# Patient Record
Sex: Female | Born: 1985 | Race: Black or African American | Hispanic: No | Marital: Single | Smoking: Never smoker
Health system: Southern US, Community
[De-identification: ages and names within clinical notes are randomized; demographics above are authoritative.]

## PROBLEM LIST (undated history)

## (undated) DIAGNOSIS — M797 Fibromyalgia: Secondary | ICD-10-CM

## (undated) DIAGNOSIS — G43909 Migraine, unspecified, not intractable, without status migrainosus: Secondary | ICD-10-CM

## (undated) HISTORY — PX: TUBAL LIGATION: SHX77

## (undated) HISTORY — PX: CHOLECYSTECTOMY: SHX55

## (undated) HISTORY — PX: TONSILLECTOMY: SUR1361

## (undated) HISTORY — PX: KNEE ARTHROSCOPY: SHX127

## (undated) HISTORY — PX: ROTATOR CUFF REPAIR: SHX139

---

## 2015-07-09 ENCOUNTER — Emergency Department (HOSPITAL_COMMUNITY): Payer: Medicare Other

## 2015-07-09 ENCOUNTER — Emergency Department (HOSPITAL_COMMUNITY)
Admission: EM | Admit: 2015-07-09 | Discharge: 2015-07-09 | Disposition: A | Discharge status: Home / Self Care | Payer: Medicare Other | Attending: Emergency Medicine | Admitting: Emergency Medicine

## 2015-07-09 ENCOUNTER — Encounter (HOSPITAL_COMMUNITY): Payer: Self-pay | Admitting: Emergency Medicine

## 2015-07-09 DIAGNOSIS — X58XXXA Exposure to other specified factors, initial encounter: Secondary | ICD-10-CM | POA: Diagnosis not present

## 2015-07-09 DIAGNOSIS — Y939 Activity, unspecified: Secondary | ICD-10-CM | POA: Insufficient documentation

## 2015-07-09 DIAGNOSIS — Z880 Allergy status to penicillin: Secondary | ICD-10-CM | POA: Diagnosis not present

## 2015-07-09 DIAGNOSIS — Y999 Unspecified external cause status: Secondary | ICD-10-CM | POA: Diagnosis not present

## 2015-07-09 DIAGNOSIS — Z8739 Personal history of other diseases of the musculoskeletal system and connective tissue: Secondary | ICD-10-CM | POA: Diagnosis not present

## 2015-07-09 DIAGNOSIS — S8991XA Unspecified injury of right lower leg, initial encounter: Secondary | ICD-10-CM | POA: Diagnosis present

## 2015-07-09 DIAGNOSIS — Y929 Unspecified place or not applicable: Secondary | ICD-10-CM | POA: Insufficient documentation

## 2015-07-09 HISTORY — DX: Fibromyalgia: M79.7

## 2015-07-09 MED ORDER — CYCLOBENZAPRINE HCL 10 MG PO TABS: 5.0000 mg | ORAL_TABLET | Freq: Two times a day (BID) | ORAL | Status: DC | PRN

## 2015-07-09 MED ORDER — CYCLOBENZAPRINE HCL 10 MG PO TABS: 5.0000 mg | ORAL_TABLET | Freq: Once | ORAL | Status: DC

## 2015-07-09 NOTE — ED Notes (Signed)
Declined W/C at D/C and was escorted to lobby by RN. 

## 2015-07-09 NOTE — Discharge Instructions (Signed)
Knee Immobilizer A knee immobilizer is used to support and protect an injured or painful knee. Knee immobilizers keep your knee from being used while it is healing. Some of the common immobilizers used include splints (air, plaster, fiberglass, stiff cloth, or aluminum) or casts. Wear your knee immobilizer as instructed and only remove it as instructed. HOME CARE INSTRUCTIONS   Use absorbent powder (such as baby powder or talcum powder) to control irritation from sweat and friction.  Adjust the immobilizer to be firm but not tight. Signs of an immobilizer that is too tight include:  Swelling.  Numbness.  Color change in your foot or ankle.  Increased pain.  While resting, raise your leg above the level of your heart. Pillows can be used for support. This reduces throbbing and helps healing.  Remove the immobilizer to bathe and sleep. SEEK MEDICAL CARE IF:   You have increasing pain or swelling in the knee, foot, or ankle.  You have problems caused by the knee immobilizer, or it breaks or needs replacement. MAKE SURE YOU:   Understand these instructions.  Will watch your condition.  Will get help right away if you are not doing well or get worse. Document Released: 09/28/2005 Document Revised: 02/12/2014 Document Reviewed: 05/22/2013 [facility] Patient Information 2015 [place], [place]. This information is not intended to replace advice given to you by your health care provider. Make sure you discuss any questions you have with your health care provider.   Meniscus Injury, Arthroscopy Arthroscopy is a surgical procedure that involves the use of a small scope that has a camera and surgical instruments on the end (arthroscope). An arthroscope can be used to repair your meniscus injury.  LET [facility] CARE PROVIDER KNOW ABOUT:  Any allergies you have.  All medicines you are taking, including vitamins, herbs, eyedrops, creams, and over-the-counter medicines.  Any recent colds  or infections you have had or currently have.  Previous problems you or members of your family have had with the use of anesthetics.  Any blood disorders or blood clotting problems you have.  Previous surgeries you have had.  Medical conditions you have. RISKS AND COMPLICATIONS Generally, this is a safe procedure. However, as with any procedure, problems can occur. Possible problems include:  Damage to nerves or blood vessels.  Excess bleeding.  Blood clots.  Infection. BEFORE THE PROCEDURE  Do not eat or drink for 6-8 hours before the procedure.  Take medicines as directed by your surgeon. Ask your surgeon about changing or stopping your regular medicines.  You may have lab tests the morning of surgery. PROCEDURE  You will be given one of the following:   A medicine that numbs the area (local anesthesia).  A medicine that makes you go to sleep (general anesthesia).  A medicine injected into your spine that numbs your body below the waist (spinal anesthesia). Most often, several small cuts (incisions) are made in the knee. The arthroscope and instruments go into the incisions to repair the damage. The torn portion of the meniscus is removed.  During this time, your surgeon may find a partial or complete tear in a cruciate ligament, such as the anterior cruciate ligament (ACL). A completely torn cruciate ligament is reconstructed by taking tissue from another part of the body (grafting) and placing it into the injured area. This requires several larger incisions to complete the repair. Sometimes, open surgery is needed for collateral ligament injuries. If a collateral ligament is found to be injured, your surgeon may  staple or suture the tear through a slightly larger incision on the side of the knee. AFTER THE PROCEDURE You will be taken to the recovery area where your progress will be monitored. When you are awake, stable, and taking fluids without complications, you will be  allowed to go home. This is usually the same day. However, more extensive repairs of a ligament may require an overnight stay.  The recovery time after repairing your meniscus or ligament depends on the amount of damage to these structures. It also depends on whether or not reconstructive knee surgery was needed.   A torn or stretched ligament (ligament sprain) may take 6-8 weeks to heal. It takes about the same amount of time if your surgeon removed a torn meniscus.  A repaired meniscus may require 6-12 weeks of recovery time.  A torn ligament needing reconstructive surgery may take 6-12 months to heal fully. Document Released: 09/25/2000 Document Revised: 10/03/2013 Document Reviewed: 02/24/2013 [facility] Patient Information 2015 [place], [place]. This information is not intended to replace advice given to you by your health care provider. Make sure you discuss any questions you have with your health care provider.

## 2015-07-09 NOTE — ED Notes (Signed)
Pt sts right knee pain x 2 days; pt sts hx of sx on same knee but denies obvious new injury

## 2015-07-09 NOTE — ED Provider Notes (Signed)
CSN: [id number]     Arrival date & time 07/09/15  1017 History  By signing my name below, I, Sylvia Sullivan, attest that this documentation has been prepared under the direction and in the presence of Sylvia Pel, PA-C. Electronically Signed: Tanda Sullivan, ED Scribe. 07/09/2015. 1:19 PM.  Chief Complaint  Patient presents with  . Knee Pain   The history is provided by the patient. No language interpreter was used.     HPI Comments: Sylvia Sullivan is a 29 y.o. female who presents to the Emergency Department complaining of gradual onset, constant, 7/10, right knee pain and swelling x 2 days. Pt has hx of right knee surgery x 3 for plica removal and meniscus tear. 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 Pt does admit to walking and climbing stairs more frequently since going to college. She also went hiking 3 weeks ago and believes she may have strained her knee. No known injury, trauma, or fall. She has been icing knee, OTC pain patches, and taking naproxen without relief. Denies weakness, numbness, tingling, or any other associated symptoms.   Past Medical History  Diagnosis Date  . Fibromyalgia    History reviewed. No pertinent past surgical history. History reviewed. No pertinent family history. Social History  Substance Use Topics  . Smoking status: Never Smoker   . Smokeless tobacco: None  . Alcohol Use: No   OB History    No data available     Review of Systems  Musculoskeletal: Positive for arthralgias (Right knee pain). Negative for gait problem.  Skin: Negative for wound.  Neurological: Negative for weakness and numbness.  All other systems reviewed and are negative.  Allergies  Butane; Penicillins; Tramadol; and Vicodin  Home Medications   Prior to Admission medications   Medication Sig Start Date End Date Taking? Authorizing Provider  cyclobenzaprine (FLEXERIL) 10 MG tablet Take 0.5-1 tablets (5-10 mg total) by mouth 2 (two) times daily as  needed. 07/09/15   Sylvia Pel, PA-C   Triage Vitals: BP 133/99 mmHg  Pulse 59  Temp(Src) 98.5 F (36.9 C) (Oral)  Resp 18  SpO2 100%   Physical Exam  Constitutional: She is oriented to person, place, and time. She appears well-developed and well-nourished. No distress.  HENT:  Head: Normocephalic and atraumatic.  Eyes: Conjunctivae and EOM are normal.  Neck: Neck supple. No tracheal deviation present.  Cardiovascular: Normal rate.   Pulmonary/Chest: Effort normal. No respiratory distress.  Musculoskeletal:       Right knee: She exhibits decreased range of motion (due to pan) and swelling. She exhibits no effusion, no ecchymosis, no deformity, no laceration, no erythema, normal alignment, no LCL laxity and normal patellar mobility. Tenderness found. Medial joint line and lateral joint line tenderness noted. No patellar tendon tenderness noted.  Neurological: She is alert and oriented to person, place, and time.  Skin: Skin is warm and dry.  Psychiatric: She has a normal mood and affect. Her behavior is normal.  Nursing note and vitals reviewed.   ED Course  Procedures (including critical care time)  DIAGNOSTIC STUDIES: Oxygen Saturation is 100% on RA, normal by my interpretation.    COORDINATION OF CARE: 1:19 PM-Discussed treatment plan which includes knee immobilizer, crutches, and referral to orthopedist with pt at bedside and pt agreed to plan.   Labs Review Labs Reviewed - No data to display  Imaging Review Dg Knee Complete 4 Views Right  07/09/2015   CLINICAL DATA:  Pain in both femoral  condyles no known injury; for approximately 4 days  EXAM: RIGHT KNEE - COMPLETE 4+ VIEW  COMPARISON:  None.  FINDINGS: There is no evidence of fracture, dislocation, or joint effusion. There is no evidence of arthropathy or other focal bone abnormality. Soft tissues are unremarkable.  IMPRESSION: Negative.   Electronically Signed   By: Sylvia Sullivan M.D.   On: 07/09/2015 12:18   I  have personally reviewed and evaluated these images as part of my medical decision-making.   EKG Interpretation None      MDM   Final diagnoses:  Knee injury, right, initial encounter    Right Knee xray IMPRESSION: Negative.  Medications - No data to display  29 y.o.Sylvia Sullivan's evaluation in the Emergency Department is complete. It has been determined that no acute conditions requiring further emergency intervention are present at this time. The patient/guardian have been advised of the diagnosis and plan. We have discussed signs and symptoms that warrant return to the ED, such as changes or worsening in symptoms.  Vital signs are stable at discharge. Filed Vitals:   07/09/15 1029  BP: 133/99  Pulse: 59  Temp: 98.5 F (36.9 C)  Resp: 18    Patient/guardian has voiced understanding and agreed to follow-up with the PCP or specialist.  I personally performed the services described in this documentation, which was scribed in my presence. The recorded information has been reviewed and is accurate.       Sylvia Pel, PA-C 07/09/15 1333  Sylvia Spates, MD 07/10/15 1200

## 2015-07-09 NOTE — ED Notes (Signed)
Hx of right knee surgery x 3 per pt. Last in 2008 in [place]. Started 3-4 days ago with right knee pain. Has been doing more walking and climbing stairs since going to college.

## 2015-09-22 ENCOUNTER — Encounter (HOSPITAL_COMMUNITY): Payer: Self-pay | Admitting: Emergency Medicine

## 2015-09-22 ENCOUNTER — Emergency Department (HOSPITAL_COMMUNITY)
Admission: EM | Admit: 2015-09-22 | Discharge: 2015-09-22 | Disposition: A | Discharge status: Home / Self Care | Payer: Medicare Other | Attending: Emergency Medicine | Admitting: Emergency Medicine

## 2015-09-22 DIAGNOSIS — M25512 Pain in left shoulder: Secondary | ICD-10-CM | POA: Diagnosis not present

## 2015-09-22 DIAGNOSIS — Z880 Allergy status to penicillin: Secondary | ICD-10-CM | POA: Diagnosis not present

## 2015-09-22 DIAGNOSIS — Z9889 Other specified postprocedural states: Secondary | ICD-10-CM | POA: Insufficient documentation

## 2015-09-22 MED ORDER — CYCLOBENZAPRINE HCL 10 MG PO TABS: 10.0000 mg | ORAL_TABLET | Freq: Two times a day (BID) | ORAL | Status: AC | PRN

## 2015-09-22 MED ORDER — OXYCODONE-ACETAMINOPHEN 5-325 MG PO TABS
2.0000 | ORAL_TABLET | Freq: Once | ORAL | Status: AC
  Administered 2015-09-22: 2 via ORAL
  Filled 2015-09-22: qty 2

## 2015-09-22 MED ORDER — OXYCODONE-ACETAMINOPHEN 5-325 MG PO TABS: 1.0000 | ORAL_TABLET | Freq: Four times a day (QID) | ORAL | Status: DC | PRN

## 2015-09-22 NOTE — ED Notes (Signed)
Patient with Hx of multiple rotator cuff surgeries to left shoulder c/o left shoulder pain onset yesterday. Denies trauma.

## 2015-09-22 NOTE — ED Provider Notes (Signed)
CSN: [id number]     Arrival date & time 09/22/15  1601 History  By signing my name below, I, Sylvia Sullivan, attest that this documentation has been prepared under the direction and in the presence of Danelle Berry, PA-C. Electronically Signed: Tanda Sullivan, ED Scribe. 09/22/2015. 5:59 PM.    Chief Complaint  Patient presents with  . Shoulder Pain   The history is provided by the patient. No language interpreter was used.     HPI Comments: Sylvia Sullivan is a 29 y.o. female who presents to the Emergency Department complaining of sudden onset, constant, 10/10, left shoulder pain radiating down to left arm x 2 days, worsening today. No known injury, trauma, fall, or strenuous activity that could have caused the pain. She has been applying heat/ ice, taking Naproxen, and trying to stretch the shoulder without relief. Denies redness, swelling, weakness, numbness, tingling, fever, nausea, vomiting, or any other associated symptoms. No hx blood clots. No recent prolonged travel. Pt has hx of multiple rotator cuff surgeries to the same shoulder and has been getting steroid injections to the shoulder. Pt's last injection was 4 months ago.  She states that the pain feels similar to after her shoulder surgeries when the nerve block wears off. No other complaints.    Past Medical History  Diagnosis Date  . Fibromyalgia    History reviewed. No pertinent past surgical history. History reviewed. No pertinent family history. Social History  Substance Use Topics  . Smoking status: Never Smoker   . Smokeless tobacco: None  . Alcohol Use: No   OB History    No data available     Review of Systems  Constitutional: Negative for fever.  Gastrointestinal: Negative for nausea and vomiting.  Musculoskeletal: Positive for arthralgias (Left shoulder. ). Negative for joint swelling.  Skin: Negative for color change and wound.  Neurological: Negative for weakness and numbness.  All other systems reviewed  and are negative.  Allergies  Butane; Penicillins; Tramadol; and Vicodin  Home Medications   Prior to Admission medications   Medication Sig Start Date End Date Taking? Authorizing Provider  cyclobenzaprine (FLEXERIL) 10 MG tablet Take 0.5-1 tablets (5-10 mg total) by mouth 2 (two) times daily as needed. 07/09/15   Marlon Pel, PA-C   Triage Vitals: BP 132/96 mmHg  Pulse 72  Temp(Src) 98.5 F (36.9 C) (Oral)  Resp 18  SpO2 100%   Physical Exam  Constitutional: She is oriented to person, place, and time. She appears well-developed and well-nourished. No distress.  HENT:  Head: Normocephalic and atraumatic.  Right Ear: External ear normal.  Left Ear: External ear normal.  Nose: Nose normal.  Mouth/Throat: Oropharynx is clear and moist. No oropharyngeal exudate.  Eyes: Conjunctivae and EOM are normal. Pupils are equal, round, and reactive to light. Right eye exhibits no discharge. Left eye exhibits no discharge. No scleral icterus.  Neck: Normal range of motion. Neck supple. No JVD present. No tracheal deviation present.  Cardiovascular: Normal rate and regular rhythm.   Pulses:      Radial pulses are 2+ on the right side, and 2+ on the left side.  Pulmonary/Chest: Effort normal and breath sounds normal. No stridor. No respiratory distress.  Musculoskeletal: She exhibits tenderness. She exhibits no edema.       Left shoulder: She exhibits decreased range of motion, tenderness, bony tenderness and pain. She exhibits no swelling, no effusion, no deformity, no spasm and normal pulse.       Arms: Lymphadenopathy:  She has no cervical adenopathy.  Neurological: She is alert and oriented to person, place, and time. No sensory deficit. She exhibits normal muscle tone. Coordination normal.  Skin: Skin is warm and dry. No rash noted. She is not diaphoretic. No erythema. No pallor.  Psychiatric: She has a normal mood and affect. Her behavior is normal. Judgment and thought content  normal.  Nursing note and vitals reviewed.   ED Course  Procedures (including critical care time)  DIAGNOSTIC STUDIES: Oxygen Saturation is 100% on RA, normal by my interpretation.    COORDINATION OF CARE: 5:53 PM-Discussed treatment plan which includes pain medication with pt at bedside and pt agreed to plan.   Labs Review Labs Reviewed - No data to display  Imaging Review No results found.   EKG Interpretation None      MDM   Pt with left shoulder pain, limited ROM, states she has had 4-5 rotator cuff surgeries, this feels like a surgery, however she denies injury, trauma, just new pain. Pt is neurovascularly intact.  She was placed in a sling immobilized and encouraged to follow up with her Ortho surgeon (s).  No indication to image.  Shoulder is tender, but there is no erythema or warmth, no concern for septic joint. D/c with muscle relaxers, pain meds, sling for comfort.  Final diagnoses:  None   I personally performed the services described in this documentation, which was scribed in my presence. The recorded information has been reviewed and is accurate.        Danelle BerryLeisa Marvelle Span, PA-C 10/08/15 [phone]  Laurence Spatesachel Morgan Little, MD 10/09/15 [phone]

## 2015-09-22 NOTE — Discharge Instructions (Signed)
Shoulder Pain The shoulder is the joint that connects your arms to your body. The bones that form the shoulder joint include the upper arm bone (humerus), the shoulder blade (scapula), and the collarbone (clavicle). The top of the humerus is shaped like a ball and fits into a rather flat socket on the scapula (glenoid cavity). A combination of muscles and strong, fibrous tissues that connect muscles to bones (tendons) support your shoulder joint and hold the ball in the socket. Small, fluid-filled sacs (bursae) are located in different areas of the joint. They act as cushions between the bones and the overlying soft tissues and help reduce friction between the gliding tendons and the bone as you move your arm. Your shoulder joint allows a wide range of motion in your arm. This range of motion allows you to do things like scratch your back or throw a ball. However, this range of motion also makes your shoulder more prone to pain from overuse and injury. Causes of shoulder pain can originate from both injury and overuse and usually can be grouped in the following four categories:  Redness, swelling, and pain (inflammation) of the tendon (tendinitis) or the bursae (bursitis).  Instability, such as a dislocation of the joint.  Inflammation of the joint (arthritis).  Broken bone (fracture). HOME CARE INSTRUCTIONS   Apply ice to the sore area.  Put ice in a plastic bag.  Place a towel between your skin and the bag.  Leave the ice on for 15-20 minutes, 3-4 times per day for the first 2 days, or as directed by your health care provider.  Stop using cold packs if they do not help with the pain.  If you have a shoulder sling or immobilizer, wear it as long as your caregiver instructs. Only remove it to shower or bathe. Move your arm as little as possible, but keep your hand moving to prevent swelling.  Squeeze a soft ball or foam pad as much as possible to help prevent swelling.  Only take  over-the-counter or prescription medicines for pain, discomfort, or fever as directed by your caregiver. SEEK MEDICAL CARE IF:   Your shoulder pain increases, or new pain develops in your arm, hand, or fingers.  Your hand or fingers become cold and numb.  Your pain is not relieved with medicines. SEEK IMMEDIATE MEDICAL CARE IF:   Your arm, hand, or fingers are numb or tingling.  Your arm, hand, or fingers are significantly swollen or turn white or blue. MAKE SURE YOU:   Understand these instructions.  Will watch your condition.  Will get help right away if you are not doing well or get worse.   This information is not intended to replace advice given to you by your health care provider. Make sure you discuss any questions you have with your health care provider.   Document Released: 07/08/2005 Document Revised: 10/19/2014 Document Reviewed: 01/21/2015 Elsevier Interactive Patient Education 2016 [employer] Rotator Cuff Injury Rotator cuff injury is any type of injury to the set of muscles and tendons that make up the stabilizing unit of your shoulder. This unit holds the ball of your upper arm bone (humerus) in the socket of your shoulder blade (scapula).  CAUSES Injuries to your rotator cuff most commonly come from sports or activities that cause your arm to be moved repeatedly over your head. Examples of this include throwing, weight lifting, swimming, or racquet sports. Long lasting (chronic) irritation of your rotator cuff can cause soreness and swelling (  inflammation), bursitis, and eventual damage to your tendons, such as a tear (rupture). SIGNS AND SYMPTOMS Acute rotator cuff tear:  Sudden tearing sensation followed by severe pain shooting from your upper shoulder down your arm toward your elbow.  Decreased range of motion of your shoulder because of pain and muscle spasm.  Severe pain.  Inability to raise your arm out to the side because of pain and loss of muscle  power (large tears). Chronic rotator cuff tear:  Pain that usually is worse at night and may interfere with sleep.  Gradual weakness and decreased shoulder motion as the pain worsens.  Decreased range of motion. Rotator cuff tendinitis:  Deep ache in your shoulder and the outside upper arm over your shoulder.  Pain that comes on gradually and becomes worse when lifting your arm to the side or turning it inward. DIAGNOSIS Rotator cuff injury is diagnosed through a medical history, physical exam, and imaging exam. The medical history helps determine the type of rotator cuff injury. Your health care provider will look at your injured shoulder, feel the injured area, and ask you to move your shoulder in different positions. X-ray exams typically are done to rule out other causes of shoulder pain, such as fractures. MRI is the exam of choice for the most severe shoulder injuries because the images show muscles and tendons.  TREATMENT  Chronic tear:  Medicine for pain, such as acetaminophen or ibuprofen.  Physical therapy and range-of-motion exercises may be helpful in maintaining shoulder function and strength.  Steroid injections into your shoulder joint.  Surgical repair of the rotator cuff if the injury does not heal with noninvasive treatment. Acute tear:  Anti-inflammatory medicines such as ibuprofen and naproxen to help reduce pain and swelling.  A sling to help support your arm and rest your rotator cuff muscles. Long-term use of a sling is not advised. It may cause significant stiffening of the shoulder joint.  Surgery may be considered within a few weeks, especially in younger, active people, to return the shoulder to full function.  Indications for surgical treatment include the following:  Age younger than 60 years.  Rotator cuff tears that are complete.  Physical therapy, rest, and anti-inflammatory medicines have been used for 6-8 weeks, with no  improvement.  Employment or sporting activity that requires constant shoulder use. Tendinitis:  Anti-inflammatory medicines such as ibuprofen and naproxen to help reduce pain and swelling.  A sling to help support your arm and rest your rotator cuff muscles. Long-term use of a sling is not advised. It may cause significant stiffening of the shoulder joint.  Severe tendinitis may require:  Steroid injections into your shoulder joint.  Physical therapy.  Surgery. HOME CARE INSTRUCTIONS   Apply ice to your injury:  Put ice in a plastic bag.  Place a towel between your skin and the bag.  Leave the ice on for 20 minutes, 2-3 times a day.  If you have a shoulder immobilizer (sling and straps), wear it until told otherwise by your health care provider.  You may want to sleep on several pillows or in a recliner at night to lessen swelling and pain.  Only take over-the-counter or prescription medicines for pain, discomfort, or fever as directed by your health care provider.  Do simple hand squeezing exercises with a soft rubber ball to decrease hand swelling. SEEK MEDICAL CARE IF:   Your shoulder pain increases, or new pain or numbness develops in your arm, hand, or fingers.  Your  hand or fingers are colder than your other hand. SEEK IMMEDIATE MEDICAL CARE IF:   Your arm, hand, or fingers are numb or tingling.  Your arm, hand, or fingers are increasingly swollen and painful, or they turn white or blue. MAKE SURE YOU:  Understand these instructions.  Will watch your condition.  Will get help right away if you are not doing well or get worse.   This information is not intended to replace advice given to you by your health care provider. Make sure you discuss any questions you have with your health care provider.   Document Released: 09/25/2000 Document Revised: 10/03/2013 Document Reviewed: 05/10/2013 Elsevier Interactive Patient Education 2016 Elsevier  Inc[employer]  Cryotherapy Cryotherapy is when you put ice on your injury. Ice helps lessen pain and puffiness (swelling) after an injury. Ice works the best when you start using it in the first 24 to 48 hours after an injury. HOME CARE  Put a dry or damp towel between the ice pack and your skin.  You may press gently on the ice pack.  Leave the ice on for no more than 10 to 20 minutes at a time.  Check your skin after 5 minutes to make sure your skin is okay.  Rest at least 20 minutes between ice pack uses.  Stop using ice when your skin loses feeling (numbness).  Do not use ice on someone who cannot tell you when it hurts. This includes small children and people with memory problems (dementia). GET HELP RIGHT AWAY IF:  You have white spots on your skin.  Your skin turns blue or pale.  Your skin feels waxy or hard.  Your puffiness gets worse. MAKE SURE YOU:   Understand these instructions.  Will watch your condition.  Will get help right away if you are not doing well or get worse.   This information is not intended to replace advice given to you by your health care provider. Make sure you discuss any questions you have with your health care provider.   Document Released: 03/16/2008 Document Revised: 12/21/2011 Document Reviewed: 05/21/2011 Elsevier Interactive Patient Education [employer]

## 2015-12-03 ENCOUNTER — Encounter (HOSPITAL_COMMUNITY): Payer: Self-pay | Admitting: *Deleted

## 2015-12-03 ENCOUNTER — Emergency Department (HOSPITAL_COMMUNITY)
Admission: EM | Admit: 2015-12-03 | Discharge: 2015-12-03 | Disposition: A | Discharge status: Home / Self Care | Payer: Medicare Other | Attending: Emergency Medicine | Admitting: Emergency Medicine

## 2015-12-03 DIAGNOSIS — M549 Dorsalgia, unspecified: Secondary | ICD-10-CM | POA: Insufficient documentation

## 2015-12-03 DIAGNOSIS — M79604 Pain in right leg: Secondary | ICD-10-CM | POA: Diagnosis not present

## 2015-12-03 DIAGNOSIS — M797 Fibromyalgia: Secondary | ICD-10-CM | POA: Insufficient documentation

## 2015-12-03 DIAGNOSIS — M5431 Sciatica, right side: Secondary | ICD-10-CM

## 2015-12-03 DIAGNOSIS — Z880 Allergy status to penicillin: Secondary | ICD-10-CM | POA: Insufficient documentation

## 2015-12-03 DIAGNOSIS — M545 Low back pain: Secondary | ICD-10-CM | POA: Diagnosis present

## 2015-12-03 MED ORDER — OXYCODONE-ACETAMINOPHEN 5-325 MG PO TABS: 1.0000 | ORAL_TABLET | ORAL | Status: DC | PRN

## 2015-12-03 MED ORDER — PREDNISONE 20 MG PO TABS: ORAL_TABLET | ORAL | Status: AC

## 2015-12-03 NOTE — ED Notes (Signed)
Patient states for the past 2-3 days she has been having a pain in her back that shoots down her right leg. Patient denies injury. She states it hurts when she active and when she is at rest. She has never had this pain before and denies any strenuous activity recently.

## 2015-12-03 NOTE — ED Notes (Signed)
Bed: WA28 Expected date:  Expected time:  Means of arrival:  Comments: 

## 2015-12-03 NOTE — ED Provider Notes (Signed)
CSN: [id number]     Arrival date & time 12/03/15  0830 History   First MD Initiated Contact with Patient 12/03/15 1009     Chief Complaint  Patient presents with  . Back Pain  . Leg Pain     (Consider location/radiation/quality/duration/timing/severity/associated sxs/prior Treatment) Patient is a 30 y.o. female presenting with back pain and leg pain. The history is provided by the patient and medical records.  Back Pain Associated symptoms: leg pain   Leg Pain Associated symptoms: back pain     31 year old female with history of fibromyalgia, presenting to the ED for back pain. Patient states been going on for 3 days. She states she has pain in her right lower back which radiates into her right leg. She denies any known injury, trauma, or falls. She states if she is sitting completely still, pain is tolerable, but becomes severe when she attempts to move, twist, or change position. She denies any numbness or weakness of her legs. No bowel or bladder incontinence. No fever, chills, history of cancer, or history of IV drug use. Patient has tried multiple over-the-counter remedies including ibuprofen, naproxen, heating pad, Epsom salt bath soaks, and Solonoas patches without relief.  VSS.  Past Medical History  Diagnosis Date  . Fibromyalgia    History reviewed. No pertinent past surgical history. No family history on file. Social History  Substance Use Topics  . Smoking status: Never Smoker   . Smokeless tobacco: None  . Alcohol Use: No   OB History    No data available     Review of Systems  Musculoskeletal: Positive for back pain.  All other systems reviewed and are negative.     Allergies  Butane; Penicillins; Tramadol; and Vicodin  Home Medications   Prior to Admission medications   Medication Sig Start Date End Date Taking? Authorizing Provider  cyclobenzaprine (FLEXERIL) 10 MG tablet Take 1 tablet (10 mg total) by mouth 2 (two) times daily as needed for muscle  spasms. 09/22/15   Danelle Berry, PA-C  oxyCODONE-acetaminophen (PERCOCET/ROXICET) 5-325 MG tablet Take 1-2 tablets by mouth every 6 (six) hours as needed for severe pain. 09/22/15   Danelle Berry, PA-C   BP 110/79 mmHg  Pulse 68  Temp(Src) 98.2 F (36.8 C) (Oral)  Resp 16  SpO2 100%  LMP 11/03/2015   Physical Exam  Constitutional: She is oriented to person, place, and time. She appears well-developed and well-nourished.  HENT:  Head: Normocephalic and atraumatic.  Mouth/Throat: Oropharynx is clear and moist.  Eyes: Conjunctivae and EOM are normal. Pupils are equal, round, and reactive to light.  Neck: Normal range of motion.  Cardiovascular: Normal rate, regular rhythm and normal heart sounds.   Pulmonary/Chest: Effort normal and breath sounds normal.  Abdominal: Soft. Bowel sounds are normal.  Musculoskeletal: Normal range of motion.  Tenderness of right SI joint without acute deformity, no midline tenderness or step-off, positive straight leg raise on right at 45, normal strength and sensation of bilateral lower extremities, normal gait  Neurological: She is alert and oriented to person, place, and time.  Skin: Skin is warm and dry.  Psychiatric: She has a normal mood and affect.  Nursing note and vitals reviewed.   ED Course  Procedures (including critical care time) Labs Review Labs Reviewed - No data to display  Imaging Review No results found. I have personally reviewed and evaluated these images and lab results as part of my medical decision-making.   EKG Interpretation None  MDM   Final diagnoses:  Back pain, unspecified location  Sciatica of right side   30 year old female here with right low back pain for the past 3 days. No known injury or trauma. Patient is afebrile, nontoxic. She has tenderness of the right SI joint but no deformity. She has a positive straight leg raise on right. She has no red flag symptoms or focal neurologic deficits to suggest  cauda equina, epidural abscess, spinal cord injury, or other emergent spinal pathology. I suspect this is a lumbar radiculopathy/sciatica. Patient be started on prednisone and percocet short term.  FU with PCP.  Discussed plan with patient, he/she acknowledged understanding and agreed with plan of care.  Return precautions given for new or worsening symptoms.  Garlon Hatchet, PA-C 12/03/15 1127  Lorre Nick, MD 12/03/15 1600

## 2015-12-03 NOTE — ED Notes (Signed)
Patient was able to ambulate within the department independently.

## 2015-12-03 NOTE — Discharge Instructions (Signed)
Take the prescribed medication as directed. Follow-up with your primary care physician. Return to the ED for new or worsening symptoms-- numbness of the legs, weakness, inability to walk, bowel or bladder incontinence, etc.   Back Pain, Adult Back pain is very common in adults.The cause of back pain is rarely dangerous and the pain often gets better over time.The cause of your back pain may not be known. Some common causes of back pain include:  Strain of the muscles or ligaments supporting the spine.  Wear and tear (degeneration) of the spinal disks.  Arthritis.  Direct injury to the back. For many people, back pain may return. Since back pain is rarely dangerous, most people can learn to manage this condition on their own. HOME CARE INSTRUCTIONS Watch your back pain for any changes. The following actions may help to lessen any discomfort you are feeling:  Remain active. It is stressful on your back to sit or stand in one place for long periods of time. Do not sit, drive, or stand in one place for more than 30 minutes at a time. Take short walks on even surfaces as soon as you are able.Try to increase the length of time you walk each day.  Exercise regularly as directed by your health care provider. Exercise helps your back heal faster. It also helps avoid future injury by keeping your muscles strong and flexible.  Do not stay in bed.Resting more than 1-2 days can delay your recovery.  Pay attention to your body when you bend and lift. The most comfortable positions are those that put less stress on your recovering back. Always use proper lifting techniques, including:  Bending your knees.  Keeping the load close to your body.  Avoiding twisting.  Find a comfortable position to sleep. Use a firm mattress and lie on your side with your knees slightly bent. If you lie on your back, put a pillow under your knees.  Avoid feeling anxious or stressed.Stress increases muscle tension  and can worsen back pain.It is important to recognize when you are anxious or stressed and learn ways to manage it, such as with exercise.  Take medicines only as directed by your health care provider. Over-the-counter medicines to reduce pain and inflammation are often the most helpful.Your health care provider may prescribe muscle relaxant drugs.These medicines help dull your pain so you can more quickly return to your normal activities and healthy exercise.  Apply ice to the injured area:  Put ice in a plastic bag.  Place a towel between your skin and the bag.  Leave the ice on for 20 minutes, 2-3 times a day for the first 2-3 days. After that, ice and heat may be alternated to reduce pain and spasms.  Maintain a healthy weight. Excess weight puts extra stress on your back and makes it difficult to maintain good posture. SEEK MEDICAL CARE IF:  You have pain that is not relieved with rest or medicine.  You have increasing pain going down into the legs or buttocks.  You have pain that does not improve in one week.  You have night pain.  You lose weight.  You have a fever or chills. SEEK IMMEDIATE MEDICAL CARE IF:   You develop new bowel or bladder control problems.  You have unusual weakness or numbness in your arms or legs.  You develop nausea or vomiting.  You develop abdominal pain.  You feel faint.   This information is not intended to replace advice given to  you by your health care provider. Make sure you discuss any questions you have with your health care provider.   Document Released: 09/28/2005 Document Revised: 10/19/2014 Document Reviewed: 01/30/2014 Elsevier Interactive Patient Education [employer].

## 2016-01-17 ENCOUNTER — Encounter (HOSPITAL_COMMUNITY): Payer: Self-pay | Admitting: Emergency Medicine

## 2016-01-17 ENCOUNTER — Emergency Department (HOSPITAL_COMMUNITY)
Admission: EM | Admit: 2016-01-17 | Discharge: 2016-01-17 | Disposition: A | Discharge status: Home / Self Care | Payer: Medicare Other | Attending: Emergency Medicine | Admitting: Emergency Medicine

## 2016-01-17 DIAGNOSIS — Z880 Allergy status to penicillin: Secondary | ICD-10-CM | POA: Diagnosis not present

## 2016-01-17 DIAGNOSIS — K0889 Other specified disorders of teeth and supporting structures: Secondary | ICD-10-CM

## 2016-01-17 DIAGNOSIS — R51 Headache: Secondary | ICD-10-CM | POA: Insufficient documentation

## 2016-01-17 DIAGNOSIS — M797 Fibromyalgia: Secondary | ICD-10-CM | POA: Diagnosis not present

## 2016-01-17 MED ORDER — OXYCODONE-ACETAMINOPHEN 5-325 MG PO TABS: 1.0000 | ORAL_TABLET | Freq: Four times a day (QID) | ORAL | Status: DC | PRN

## 2016-01-17 MED ORDER — KETOROLAC TROMETHAMINE 60 MG/2ML IM SOLN
60.0000 mg | Freq: Once | INTRAMUSCULAR | Status: AC
  Administered 2016-01-17: 60 mg via INTRAMUSCULAR
  Filled 2016-01-17: qty 2

## 2016-01-17 MED ORDER — IBUPROFEN 800 MG PO TABS: 800.0000 mg | ORAL_TABLET | Freq: Three times a day (TID) | ORAL | Status: DC | PRN

## 2016-01-17 MED ORDER — CLINDAMYCIN HCL 300 MG PO CAPS: 300.0000 mg | ORAL_CAPSULE | Freq: Three times a day (TID) | ORAL | Status: AC

## 2016-01-17 NOTE — Discharge Instructions (Signed)
Return here as needed.  Follow-up with the dentist provided.  Use warm compresses along the jawline

## 2016-01-17 NOTE — ED Provider Notes (Signed)
CSN: [id number]     Arrival date & time 01/17/16  1303 History  By signing my name below, I, Lorenza Chick, attest that this documentation has been prepared under the direction and in the presence of Ebbie Ridge, PA-C  Electronically Signed: Lorenza Chick, ED Scribe. 01/17/2016. 2:42 PM.     Chief Complaint  Patient presents with  . Dental Pain  . Facial Pain    The history is provided by the patient. No language interpreter was used.    HPI Comments: Sylvia Sullivan is a 30 y.o. female who presents to the Emergency Department complaining of constant dental pain on the lower right side of her mouth x a few days. Pt states the pain radiates down into the right side of her jaw and neck. The pain is exacerbated when she brushes  her tongue against her teeth. Pt has no other symptoms or complaints at this time.    Past Medical History  Diagnosis Date  . Fibromyalgia    History reviewed. No pertinent past surgical history. History reviewed. No pertinent family history. Social History  Substance Use Topics  . Smoking status: Never Smoker   . Smokeless tobacco: None  . Alcohol Use: No   OB History    No data available     Review of Systems  A complete 10 system review of systems was obtained and all systems are negative except as noted in the HPI and PMH.    Allergies  Butane; Penicillins; Tramadol; and Vicodin  Home Medications   Prior to Admission medications   Medication Sig Start Date End Date Taking? Authorizing Provider  cyclobenzaprine (FLEXERIL) 10 MG tablet Take 1 tablet (10 mg total) by mouth 2 (two) times daily as needed for muscle spasms. 09/22/15   Danelle Berry, PA-C  oxyCODONE-acetaminophen (PERCOCET/ROXICET) 5-325 MG tablet Take 1 tablet by mouth every 4 (four) hours as needed. 12/03/15   Garlon Hatchet, PA-C  predniSONE (DELTASONE) 20 MG tablet Take 40 mg by mouth daily for 3 days, then  by mouth daily for 3 days, then  daily for 3 days 12/03/15   Garlon Hatchet, PA-C   BP 134/97 mmHg  Pulse 65  Temp(Src) 98.3 F (36.8 C) (Oral)  Resp 16  SpO2 99% Physical Exam  Constitutional: She is oriented to person, place, and time. She appears well-developed and well-nourished. No distress.  HENT:  Head: Normocephalic and atraumatic.  Mouth/Throat: No dental abscesses.  Tenderness along the lower right side of the gums. No abscess observed at this time.  No swelling in the gums.    Eyes: Conjunctivae and EOM are normal.  Neck:  No swelling in the neck.  Cardiovascular: Normal rate.   Pulmonary/Chest: Effort normal.  Abdominal: Soft. There is no CVA tenderness.  Musculoskeletal: Normal range of motion.  Neurological: She is alert and oriented to person, place, and time.  Skin: Skin is warm and dry.  Psychiatric: She has a normal mood and affect. Her behavior is normal.  Nursing note and vitals reviewed.   ED Course  Procedures (including critical care time)  DIAGNOSTIC STUDIES: Oxygen Saturation is 99% on RA, normal by my interpretation.    COORDINATION OF CARE: 2:23 PM-Discussed treatment plan with pt at bedside and pt agreed to plan.     MDM  Patient with dentalgia.  No abscess requiring immediate incision and drainage.  Exam not concerning for Ludwig's angina or pharyngeal abscess. Pt instructed to follow-up with dentist.  Discussed return precautions. Pt  safe for discharge.  I personally performed the services described in this documentation, which was scribed in my presence. The recorded information has been reviewed and is accurate.   Charlestine NightChristopher Drew Lips, PA-C 01/17/16 1943  Lyndal Pulleyaniel Knott, MD 01/18/16 [phone]

## 2016-03-24 ENCOUNTER — Encounter (HOSPITAL_COMMUNITY): Payer: Self-pay

## 2016-03-24 ENCOUNTER — Emergency Department (HOSPITAL_COMMUNITY)
Admission: EM | Admit: 2016-03-24 | Discharge: 2016-03-24 | Disposition: A | Discharge status: Home / Self Care | Payer: Medicare Other | Attending: Emergency Medicine | Admitting: Emergency Medicine

## 2016-03-24 DIAGNOSIS — M545 Low back pain, unspecified: Secondary | ICD-10-CM

## 2016-03-24 DIAGNOSIS — Z791 Long term (current) use of non-steroidal anti-inflammatories (NSAID): Secondary | ICD-10-CM | POA: Insufficient documentation

## 2016-03-24 DIAGNOSIS — Z79899 Other long term (current) drug therapy: Secondary | ICD-10-CM | POA: Insufficient documentation

## 2016-03-24 MED ORDER — KETOROLAC TROMETHAMINE 30 MG/ML IJ SOLN
INTRAMUSCULAR | Status: AC
  Filled 2016-03-24: qty 1

## 2016-03-24 MED ORDER — KETOROLAC TROMETHAMINE 30 MG/ML IJ SOLN
30.0000 mg | Freq: Once | INTRAMUSCULAR | Status: AC
  Administered 2016-03-24: 30 mg via INTRAMUSCULAR
  Filled 2016-03-24: qty 1

## 2016-03-24 MED ORDER — MELOXICAM 7.5 MG PO TABS: 15.0000 mg | ORAL_TABLET | Freq: Every day | ORAL | Status: AC

## 2016-03-24 NOTE — ED Provider Notes (Signed)
CSN: [id number]     Arrival date & time 03/24/16  1753 History  By signing my name below, I, Iona BeardChristian Pulliam, attest that this documentation has been prepared under the direction and in the presence of [employer] MillsBenjamin Macenzie Burford, PA-C.   Electronically Signed: Iona Beardhristian Pulliam, ED Scribe 03/24/2016 at 6:58 PM.  Chief Complaint  Patient presents with  . Back Pain  . Hip Pain   The history is provided by the patient. No language interpreter was used.   HPI Comments: Sylvia Sullivan is a 30 y.o. female with PMHx of fibromyalgia, migraines, and sciatic pain who presents to the Emergency Department complaining of gradual onset, constant, shooting, right sided lower back pain, ongoing for one week, worsening two days ago. She reports her pain radiates into her right buttock, right hip, and right leg. She works a Producer, television/film/videomanual job and believes she may have aggravated the area. Pt states her symptoms present similarly to her previous episodes of sciatica pain. No other associated symptoms noted. Pt has tried 800mg  ibuprofen, ice, heating pad, tiger balm, and epsom salt baths with minimal relief to symptoms. Her pain is worse with movement and when she puts weight on her right leg. No other worsening or alleviating factors noted. Pt denies fever, chills, iv drug use, numbness, tingling, weakness, urinary incontinence, bowel incontinence, abdominal pain, difficulty ambulating, rash, or any other pertinent symptoms.   Past Medical History  Diagnosis Date  . Fibromyalgia    History reviewed. No pertinent past surgical history. History reviewed. No pertinent family history. Social History  Substance Use Topics  . Smoking status: Never Smoker   . Smokeless tobacco: None  . Alcohol Use: No   OB History    No data available     Review of Systems A complete 10 system review of systems was obtained and all systems are negative except as noted in the HPI and PMH.    Allergies  Butane; Penicillins; Tramadol; and  Vicodin  Home Medications   Prior to Admission medications   Medication Sig Start Date End Date Taking? Authorizing Provider  amphetamine-dextroamphetamine (ADDERALL) 20 MG tablet Take 20 mg by mouth daily as needed (adhd).  02/12/16  Yes Historical Provider, MD  clonazePAM (KLONOPIN) 1 MG tablet Take 1 mg by mouth 2 (two) times daily as needed for anxiety.  03/13/16  Yes Historical Provider, MD  ibuprofen (ADVIL,MOTRIN) 800 MG tablet Take 1 tablet (800 mg total) by mouth every 8 (eight) hours as needed. 01/17/16  Yes Christopher Lawyer, PA-C  Multiple Vitamins-Minerals (MULTIVITAMIN & MINERAL PO) Take 1 tablet by mouth daily.   Yes Historical Provider, MD  omeprazole (PRILOSEC) 20 MG capsule Take 20 mg by mouth daily as needed (heartburn).  03/23/16  Yes Historical Provider, MD  propranolol ER (INDERAL LA) 60 MG 24 hr capsule Take 60 mg by mouth daily.  02/25/16  Yes Historical Provider, MD  topiramate (TOPAMAX) 100 MG tablet Take 100-200 mg by mouth 2 (two) times daily. Take 1 tablet (100 mg) in the morning and Take 2 tablets (200 mg) at bedtime. 03/23/16  Yes Historical Provider, MD  valACYclovir (VALTREX) 500 MG tablet Take 500 mg by mouth daily.  02/25/16  Yes Historical Provider, MD  zolpidem (AMBIEN) 10 MG tablet Take 10 mg by mouth at bedtime as needed for sleep.  02/25/16  Yes Historical Provider, MD  clindamycin (CLEOCIN) 300 MG capsule Take 1 capsule (300 mg total) by mouth 3 (three) times daily. Patient not taking: Reported on 03/24/2016 01/17/16  Christopher Lawyer, PA-C  cyclobenzaprine (FLEXERIL) 10 MG tablet Take 1 tablet (10 mg total) by mouth 2 (two) times daily as needed for muscle spasms. Patient not taking: Reported on 03/24/2016 09/22/15   Danelle Berry, PA-C  oxyCODONE-acetaminophen (PERCOCET/ROXICET) 5-325 MG tablet Take 1 tablet by mouth every 6 (six) hours as needed for severe pain. Patient not taking: Reported on 03/24/2016 01/17/16   Charlestine Night, PA-C  predniSONE (DELTASONE) 20  MG tablet Take 40 mg by mouth daily for 3 days, then  by mouth daily for 3 days, then  daily for 3 days Patient not taking: Reported on 03/24/2016 12/03/15   Garlon Hatchet, PA-C   BP 131/87 mmHg  Pulse 74  Temp(Src) 97.2 F (36.2 C) (Oral)  Resp 16  SpO2 100%  LMP 03/10/2016 Physical Exam  Constitutional: She appears well-developed and well-nourished. No distress.  HENT:  Head: Normocephalic and atraumatic.  Eyes: Conjunctivae and EOM are normal. Right eye exhibits no discharge. Left eye exhibits no discharge. No scleral icterus.  Neck: Normal range of motion. Neck supple. No tracheal deviation present.  Cardiovascular: Normal rate.   Pulmonary/Chest: Effort normal. No respiratory distress.  Abdominal: Soft. She exhibits no distension. There is no tenderness.  Musculoskeletal: Normal range of motion.  Diffuse tenderness in left lumbar paraspinal musculature. No focal midline bony or spinous process tenderness. No crepitus, tenting, rash or other skin abnormality. Full active range of motion of CTL spine. Full active range of motion of all extremities.  Neurological: She is alert.  Motor strength and sensation appear baseline for patient. Able to stand on tiptoe, dorsiflex great toe. Gait baseline. Negative right straight leg raise. However movement of right leg exacerbates discomfort.  Skin: Skin is warm and dry. She is not diaphoretic.  Psychiatric: She has a normal mood and affect. Her behavior is normal.    ED Course  Procedures (including critical care time) DIAGNOSTIC STUDIES: Oxygen Saturation is 100% on RA, normal by my interpretation.    COORDINATION OF CARE: 7:00 PM Discussed treatment plan which includes with pt at bedside and pt agreed to plan.  Labs Review Labs Reviewed - No data to display  Imaging Review No results found.   EKG Interpretation None     Meds given in ED:  Medications - No data to display  New Prescriptions   MELOXICAM (MOBIC) 7.5  MG TABLET    Take 2 tablets (15 mg total) by mouth daily.   Filed Vitals:   03/24/16 1759 03/24/16 1826  BP: 160/104 131/87  Pulse: 74   Temp: 97.2 F (36.2 C)   TempSrc: Oral   Resp: 16   SpO2: 100%     MDM  Patient with back pain--likely MSK in etiology.  No neurological deficits and normal neuro exam.  Patient can walk but states is painful.  No loss of bowel or bladder control.  No concern for cauda equina.  No fever, night sweats, weight loss, h/o cancer, IVDU.  RICE protocol and pain medicine indicated and discussed with patient. We will try second trial of NSAID therapy. Follow-up with PCP in 2-3 days for reevaluation.  Final diagnoses:  Right-sided low back pain without sciatica   I personally performed the services described in this documentation, which was scribed in my presence. The recorded information has been reviewed and is accurate.      Joycie Peek, PA-C 03/24/16 1913  Arby Barrette, MD 03/28/16 [phone]

## 2016-03-24 NOTE — Discharge Instructions (Signed)
Please take your medications as prescribed. Follow-up with your doctor as needed. Return to ED for new or worsening symptoms.  Back Pain, Adult Back pain is very common in adults.The cause of back pain is rarely dangerous and the pain often gets better over time.The cause of your back pain may not be known. Some common causes of back pain include:  Strain of the muscles or ligaments supporting the spine.  Wear and tear (degeneration) of the spinal disks.  Arthritis.  Direct injury to the back. For many people, back pain may return. Since back pain is rarely dangerous, most people can learn to manage this condition on their own. HOME CARE INSTRUCTIONS Watch your back pain for any changes. The following actions may help to lessen any discomfort you are feeling:  Remain active. It is stressful on your back to sit or stand in one place for long periods of time. Do not sit, drive, or stand in one place for more than 30 minutes at a time. Take short walks on even surfaces as soon as you are able.Try to increase the length of time you walk each day.  Exercise regularly as directed by your health care provider. Exercise helps your back heal faster. It also helps avoid future injury by keeping your muscles strong and flexible.  Do not stay in bed.Resting more than 1-2 days can delay your recovery.  Pay attention to your body when you bend and lift. The most comfortable positions are those that put less stress on your recovering back. Always use proper lifting techniques, including:  Bending your knees.  Keeping the load close to your body.  Avoiding twisting.  Find a comfortable position to sleep. Use a firm mattress and lie on your side with your knees slightly bent. If you lie on your back, put a pillow under your knees.  Avoid feeling anxious or stressed.Stress increases muscle tension and can worsen back pain.It is important to recognize when you are anxious or stressed and learn  ways to manage it, such as with exercise.  Take medicines only as directed by your health care provider. Over-the-counter medicines to reduce pain and inflammation are often the most helpful.Your health care provider may prescribe muscle relaxant drugs.These medicines help dull your pain so you can more quickly return to your normal activities and healthy exercise.  Apply ice to the injured area:  Put ice in a plastic bag.  Place a towel between your skin and the bag.  Leave the ice on for 20 minutes, 2-3 times a day for the first 2-3 days. After that, ice and heat may be alternated to reduce pain and spasms.  Maintain a healthy weight. Excess weight puts extra stress on your back and makes it difficult to maintain good posture. SEEK MEDICAL CARE IF:  You have pain that is not relieved with rest or medicine.  You have increasing pain going down into the legs or buttocks.  You have pain that does not improve in one week.  You have night pain.  You lose weight.  You have a fever or chills. SEEK IMMEDIATE MEDICAL CARE IF:   You develop new bowel or bladder control problems.  You have unusual weakness or numbness in your arms or legs.  You develop nausea or vomiting.  You develop abdominal pain.  You feel faint.   This information is not intended to replace advice given to you by your health care provider. Make sure you discuss any questions you have with  your health care provider.   Document Released: 09/28/2005 Document Revised: 10/19/2014 Document Reviewed: 01/30/2014 Elsevier Interactive Patient Education [employer]

## 2016-03-24 NOTE — ED Notes (Signed)
Pt with rt back/hip/upper leg pain.  Hx of sciatic pain. Pt has manual job.  Pain started 2 days ago.  Pain worse with movement.  No change in urination.  No fever.

## 2016-04-26 ENCOUNTER — Emergency Department (HOSPITAL_COMMUNITY)
Admission: EM | Admit: 2016-04-26 | Discharge: 2016-04-26 | Disposition: A | Discharge status: Home / Self Care | Payer: Medicare Other | Attending: Emergency Medicine | Admitting: Emergency Medicine

## 2016-04-26 ENCOUNTER — Emergency Department (HOSPITAL_COMMUNITY)
Admission: EM | Admit: 2016-04-26 | Discharge: 2016-04-26 | Disposition: A | Discharge status: Home / Self Care | Payer: Medicare Other | Source: Home / Self Care | Attending: Emergency Medicine | Admitting: Emergency Medicine

## 2016-04-26 ENCOUNTER — Encounter (HOSPITAL_COMMUNITY): Payer: Self-pay | Admitting: Emergency Medicine

## 2016-04-26 ENCOUNTER — Emergency Department (HOSPITAL_COMMUNITY): Payer: Medicare Other

## 2016-04-26 ENCOUNTER — Encounter (HOSPITAL_COMMUNITY): Payer: Self-pay | Admitting: *Deleted

## 2016-04-26 DIAGNOSIS — K047 Periapical abscess without sinus: Secondary | ICD-10-CM

## 2016-04-26 DIAGNOSIS — Z7952 Long term (current) use of systemic steroids: Secondary | ICD-10-CM

## 2016-04-26 DIAGNOSIS — Z79899 Other long term (current) drug therapy: Secondary | ICD-10-CM | POA: Insufficient documentation

## 2016-04-26 DIAGNOSIS — K029 Dental caries, unspecified: Secondary | ICD-10-CM | POA: Insufficient documentation

## 2016-04-26 DIAGNOSIS — K0889 Other specified disorders of teeth and supporting structures: Secondary | ICD-10-CM | POA: Diagnosis present

## 2016-04-26 DIAGNOSIS — Z792 Long term (current) use of antibiotics: Secondary | ICD-10-CM

## 2016-04-26 LAB — I-STAT BETA HCG BLOOD, ED (MC, WL, AP ONLY): I-stat hCG, quantitative: <5 m[IU]/mL (ref <5)

## 2016-04-26 LAB — I-STAT CHEM 8, ED
BUN: 6 mg/dL (ref 6–20)
CHLORIDE: 118 mmol/L — AB (ref 101–111)
CREATININE: 0.7 mg/dL (ref 0.44–1.00)
Calcium, Ion: 1.19 mmol/L (ref 1.13–1.30)
GLUCOSE: 98 mg/dL (ref 65–99)
HCT: 40 % (ref 36.0–46.0)
Hemoglobin: 13.6 g/dL (ref 12.0–15.0)
POTASSIUM: 4 mmol/L (ref 3.5–5.1)
Sodium: 142 mmol/L (ref 135–145)
TCO2: 23 mmol/L (ref 0–100)

## 2016-04-26 MED ORDER — OXYCODONE-ACETAMINOPHEN 5-325 MG PO TABS: 1.0000 | ORAL_TABLET | ORAL | Status: DC | PRN

## 2016-04-26 MED ORDER — ONDANSETRON HCL 4 MG/2ML IJ SOLN
4.0000 mg | Freq: Once | INTRAMUSCULAR | Status: AC
  Administered 2016-04-26: 4 mg via INTRAVENOUS
  Filled 2016-04-26: qty 2

## 2016-04-26 MED ORDER — HYDROMORPHONE HCL 1 MG/ML IJ SOLN
0.5000 mg | Freq: Once | INTRAMUSCULAR | Status: AC
  Administered 2016-04-26: 0.5 mg via INTRAVENOUS
  Filled 2016-04-26: qty 1

## 2016-04-26 MED ORDER — IBUPROFEN 800 MG PO TABS: 800.0000 mg | ORAL_TABLET | Freq: Three times a day (TID) | ORAL | Status: AC

## 2016-04-26 MED ORDER — SODIUM CHLORIDE 0.9 % IV BOLUS (SEPSIS)
500.0000 mL | Freq: Once | INTRAVENOUS | Status: AC
  Administered 2016-04-26: 500 mL via INTRAVENOUS

## 2016-04-26 MED ORDER — FENTANYL CITRATE (PF) 100 MCG/2ML IJ SOLN
50.0000 ug | Freq: Once | INTRAMUSCULAR | Status: AC
Start: 2016-04-26 — End: 2016-04-26
  Administered 2016-04-26: 50 ug via INTRAVENOUS
  Filled 2016-04-26: qty 2

## 2016-04-26 MED ORDER — IOPAMIDOL (ISOVUE-300) INJECTION 61%
INTRAVENOUS | Status: AC
  Filled 2016-04-26: qty 75

## 2016-04-26 NOTE — ED Notes (Signed)
Returned from CT.

## 2016-04-26 NOTE — ED Notes (Addendum)
Advised A Harris, PA, pt requesting stronger pain med - in w/pt.

## 2016-04-26 NOTE — ED Notes (Signed)
IV attempted x 2 - unsuccessful - order IV team consult.

## 2016-04-26 NOTE — ED Provider Notes (Signed)
History  By signing my name below, I, Earmon PhoenixJennifer Waddell, attest that this documentation has been prepared under the direction and in the presence of Arthor CaptainAbigail Lawsen Arnott, PA-C. Electronically Signed: Earmon PhoenixJennifer Waddell, ED Scribe. 04/26/2016. 3:01 PM.  Chief Complaint  Patient presents with  . Dental Problem   HPI  HPI Comments:  Sylvia Sullivan is a 30 y.o. female who presents to the Emergency Department complaining of right lower dental pain that began three days ago. Pt [place] and prescribed Clindamycin and Naproxen she reports taking as directed and [facility] and was prescribed Ibuprofen. Pt reports she started experiencing significant swelling and pain to the right side of face after beginning the antibiotic therapy. She states the pain radiates from her right jaw to her right neck and right shoulder. Touching the area or eating increase her pain. She denies alleviating factors. She denies difficulty swallowing or breathing, sore throat, fever, chills, pain with head movement.  Past Medical History  Diagnosis Date  . Fibromyalgia    History reviewed. No pertinent past surgical history. History reviewed. No pertinent family history. Social History  Substance Use Topics  . Smoking status: Never Smoker   . Smokeless tobacco: None  . Alcohol Use: No   OB History    No data available     Review of Systems  Constitutional: Negative for fever and chills.  HENT: Positive for dental problem. Negative for sore throat and trouble swallowing.   Respiratory: Negative for shortness of breath.     Allergies  Butane; Penicillins; Tramadol; and Vicodin  Home Medications   Prior to Admission medications   Medication Sig Start Date End Date Taking? Authorizing Provider  amphetamine-dextroamphetamine (ADDERALL) 20 MG tablet Take 20 mg by mouth daily as needed (adhd).  02/12/16   Historical Provider, MD  clindamycin  (CLEOCIN) 300 MG capsule Take 1 capsule (300 mg total) by mouth 3 (three) times daily. Patient not taking: Reported on 03/24/2016 01/17/16   Charlestine Nighthristopher Lawyer, PA-C  clonazePAM (KLONOPIN) 1 MG tablet Take 1 mg by mouth 2 (two) times daily as needed for anxiety.  03/13/16   Historical Provider, MD  cyclobenzaprine (FLEXERIL) 10 MG tablet Take 1 tablet (10 mg total) by mouth 2 (two) times daily as needed for muscle spasms. Patient not taking: Reported on 03/24/2016 09/22/15   Danelle BerryLeisa Tapia, PA-C  ibuprofen (ADVIL,MOTRIN) 800 MG tablet Take 1 tablet (800 mg total) by mouth 3 (three) times daily. 04/26/16   Garlon HatchetLisa M Sanders, PA-C  meloxicam (MOBIC) 7.5 MG tablet Take 2 tablets (15 mg total) by mouth daily. 03/24/16   Joycie PeekBenjamin Cartner, PA-C  Multiple Vitamins-Minerals (MULTIVITAMIN & MINERAL PO) Take 1 tablet by mouth daily.    Historical Provider, MD  omeprazole (PRILOSEC) 20 MG capsule Take 20 mg by mouth daily as needed (heartburn).  03/23/16   Historical Provider, MD  oxyCODONE-acetaminophen (PERCOCET/ROXICET) 5-325 MG tablet Take 1 tablet by mouth every 6 (six) hours as needed for severe pain. Patient not taking: Reported on 03/24/2016 01/17/16   Charlestine Nighthristopher Lawyer, PA-C  predniSONE (DELTASONE) 20 MG tablet Take 40 mg by mouth daily for 3 days, then 20mg  by mouth daily for 3 days, then 10mg  daily for 3 days Patient not taking: Reported on 03/24/2016 12/03/15   Garlon HatchetLisa M Sanders, PA-C  propranolol ER (INDERAL LA) 60 MG 24 hr capsule Take 60 mg by mouth daily.  02/25/16   Historical Provider, MD  topiramate (TOPAMAX) 100 MG tablet Take 100-200 mg by mouth 2 (two) times daily. Take 1 tablet (100 mg) in the morning and Take 2 tablets (200 mg) at bedtime. 03/23/16   Historical Provider, MD  valACYclovir (VALTREX) 500 MG tablet Take 500 mg by mouth daily.  02/25/16   Historical Provider, MD  zolpidem (AMBIEN) 10 MG tablet Take 10 mg by mouth at bedtime as needed for sleep.  02/25/16   Historical Provider, MD   Triage Vitals: BP  140/84 mmHg  Pulse 64  Temp(Src) 98.4 F (36.9 C) (Oral)  Resp 16  SpO2 100% Physical Exam  Constitutional: She is oriented to person, place, and time. She appears well-developed and well-nourished.  HENT:  Head: Normocephalic and atraumatic.  Mouth/Throat: There is trismus in the jaw. Abnormal dentition. Dental caries present. No dental abscesses.  Tenderness to gumline of right lower jaw. No erythema or swelling of gingiva. Tenderness to palpation of right lower jaw and SCM of right neck. No tenderness to palpation to bony structures of right face. Missing multiple teeth on both sides of mouth. No tongue swelling.  Eyes: EOM are normal.  Neck: Normal range of motion.  Cardiovascular: Normal rate.   Pulmonary/Chest: Effort normal.  Musculoskeletal: Normal range of motion.  Neurological: She is alert and oriented to person, place, and time.  Skin: Skin is warm and dry.  Psychiatric: She has a normal mood and affect. Her behavior is normal.  Nursing note and vitals reviewed.   ED Course  Procedures (including critical care time) DIAGNOSTIC STUDIES: Oxygen Saturation is 100% on RA, normal by my interpretation.   COORDINATION OF CARE: 11:28 AM- Will order CT. Pt verbalizes understanding and agrees to plan.  Medications  iopamidol (ISOVUE-300) 61 % injection (not administered)  sodium chloride 0.9 % bolus 500 mL (500 mLs Intravenous New Bag/Given 04/26/16 1321)  fentaNYL (SUBLIMAZE) injection 50 mcg (50 mcg Intravenous Given 04/26/16 1423)  ondansetron (ZOFRAN) injection 4 mg (4 mg Intravenous Given 04/26/16 1423)    Labs Review Labs Reviewed  I-STAT CHEM 8, ED - Abnormal; Notable for the following:    Chloride 118 (*)    All other components within normal limits  I-STAT BETA HCG BLOOD, ED ([facility], [facility], [facility] ONLY)    Imaging Review Ct Soft Tissue Neck W Contrast  04/26/2016  CLINICAL DATA:  Right facial swelling.  Right jaw pain. EXAM: CT NECK WITH CONTRAST TECHNIQUE: Multidetector  CT imaging of the neck was performed using the standard protocol following the bolus administration of intravenous contrast. CONTRAST:  75 mL Isovue-300 COMPARISON:  None. FINDINGS: Pharynx and larynx: No focal mucosal or submucosal lesions are present. The nasopharynx, oropharynx, and hypopharynx are within normal limits. Vocal cords are midline and symmetric. Salivary glands: The parotid glands are within normal limits bilaterally. There stranding superficial to the masticator space on the right and just anterior to the right parotid gland. The submandibular glands are normal bilaterally. Thyroid: Negative Lymph nodes: Reactive size level 2 lymph nodes are present bilaterally. There is no significant cervical adenopathy. Reactive type submandibular lymph nodes are also present on the right. Vascular: No significant vascular calcifications are present. Limited intracranial: Not imaged. Visualized orbits: Within normal limits. Mastoids and visualized paranasal sinuses: Cleared Skeleton: No focal lytic or blastic lesions are present. There is some reversal of normal cervical lordosis. Upper chest: The lung apices are clear. The superior mediastinum is within normal limits. Periapical lucency is present surrounding the residual right mandibular molar. There inflammatory changes  lateral to the right mandible, likely odontogenic in origin. Extensive subcutaneous stranding is noted with some skin thickening. There is thickening of the right platysma muscle is well. No abscess is present. IMPRESSION: 1. Inflammatory changes lateral the right mandible and extending to the skin surface appear to be of odontogenic origin with periapical lucency at the roots of the residual right maxillary molar. 2. No other significant dental disease. 3. Mild cervical adenopathy is likely reactive. Electronically Signed   By: Marin Roberts M.D.   On: 04/26/2016 14:54   I have personally reviewed and evaluated these images and lab  results as part of my medical decision-making.   EKG Interpretation None      MDM   Final diagnoses:  Periapical abscess with facial involvement    Patient CT shows periapical abscess. Dental consult pending. I have given sign out to PA Corry. Pt looked up in the [facility] and was last prescribed Tylenol #3 on 03/23/16 for 6 tablets. She has received multiple narcotic prescriptions from various providers in different cities.   I personally performed the services described in this documentation, which was scribed in my presence. The recorded information has been reviewed and is accurate.       Arthor Captain, PA-C 05/04/16 1559  Derwood Kaplan, MD 05/05/16 [phone]

## 2016-04-26 NOTE — ED Notes (Signed)
Water and apple sauce given as requested so make take antibiotic.

## 2016-04-26 NOTE — ED Notes (Signed)
C/o right lower dental pain. Has been seen in the ER for this recently and been given antibiotics. Pain and swelling has increased over weekend, has not been seen by a dentist. Poor dentition, missing tooth to right lower gum area. Pain with talking/eating

## 2016-04-26 NOTE — Discharge Instructions (Signed)
Dental Abscess A dental abscess is a collection of pus in or around a tooth. CAUSES This condition is caused by a bacterial infection around the root of the tooth that involves the inner part of the tooth (pulp). It may result from:  Severe tooth decay.  Trauma to the tooth that allows bacteria to enter into the pulp, such as a broken or chipped tooth.  Severe gum disease around a tooth. SYMPTOMS Symptoms of this condition include:  Severe pain in and around the infected tooth.  Swelling and redness around the infected tooth, in the mouth, or in the face.  Tenderness.  Pus drainage.  Bad breath.  Bitter taste in the mouth.  Difficulty swallowing.  Difficulty opening the mouth.  Nausea.  Vomiting.  Chills.  Swollen neck glands.  Fever. DIAGNOSIS This condition is diagnosed with examination of the infected tooth. During the exam, your dentist may tap on the infected tooth. Your dentist will also ask about your medical and dental history and may order X-rays. TREATMENT This condition is treated by eliminating the infection. This may be done with:  Antibiotic medicine.  A root canal. This may be performed to save the tooth.  Pulling (extracting) the tooth. This may also involve draining the abscess. This is done if the tooth cannot be saved. HOME CARE INSTRUCTIONS  Take medicines only as directed by your dentist.  If you were prescribed antibiotic medicine, finish all of it even if you start to feel better.  Rinse your mouth (gargle) often with salt water to relieve pain or swelling.  Do not drive or operate heavy machinery while taking pain medicine.  Do not apply heat to the outside of your mouth.  Keep all follow-up visits as directed by your dentist. This is important. SEEK MEDICAL CARE IF:  Your pain is worse and is not helped by medicine. SEEK IMMEDIATE MEDICAL CARE IF:  You have a fever or chills.  Your symptoms suddenly get worse.  You have a  very bad headache.  You have problems breathing or swallowing.  You have trouble opening your mouth.  You have swelling in your neck or around your eye.   This information is not intended to replace advice given to you by your health care provider. Make sure you discuss any questions you have with your health care provider.   Document Released: 09/28/2005 Document Revised: 02/12/2015 Document Reviewed: 09/25/2014 Elsevier Interactive Patient Education 2016 Elsevier Inc.  [facility]  Community Service Learning [facility]  [address]  [place], [place] [address]  Phone [phone]  The [employer] [facility] [place] exemplifies the [employer] vision to improve the health and quality of life of all [place]ians by Public house manager with a passion to care for the underserved and by leading the nation in community-based, service learning oral health education. We are committed to offering comprehensive general dental services for adults, children and special needs patients in a safe, caring and professional setting.  Appointments: Our clinic is open Monday through Friday 8:00 a.m. until 5:00 p.m. The amount of time scheduled for an appointment depends on the patients specific needs. We ask that you keep your appointed time for care or provide 24-hour notice of all appointment changes. Parents or legal guardians must accompany minor children.  Payment for Services: Medicaid and other insurance plans are welcome. Payment for services is due when services are  rendered and may be made by cash or credit card. If you have dental insurance, we will assist you with your claim submission.   Emergencies: Emergency services will be provided Monday through Friday on a walk-in basis. Please arrive early for emergency services. After hours emergency services  will be provided for patients of record as required.  Services:  Medical illustratorComprehensive General Dentistry  Childrens Dentistry  Oral Surgery - Extractions  Root Canals  Sealants and Tooth Colored Fillings  Crowns and Bridges  Dentures and Partial Dentures  Implant Services  Periodontal Services and Cleanings  Cosmetic Building services engineerTooth Whitening  Digital Radiography  3-D/Cone Beam Imaging

## 2016-04-26 NOTE — ED Notes (Signed)
PT seen at [facility] On 04-24-16 and started on clindamycine and naproxen and this has not helped . RT side of PT face is swollen ,voice clear . Pt reports her mouth feels stiff when trying to olpen it.

## 2016-04-26 NOTE — ED Notes (Signed)
Pt in CT.

## 2016-04-26 NOTE — ED Notes (Signed)
[place] - [facility] and is concerned d/t swelling continues.

## 2016-04-26 NOTE — ED Provider Notes (Signed)
CSN: [id number]     Arrival date & time 04/26/16  0846 History   First MD Initiated Contact with Patient 04/26/16 [phone]     Chief Complaint  Patient presents with  . Dental Pain     (Consider location/radiation/quality/duration/timing/severity/associated sxs/prior Treatment) Patient is a 30 y.o. female presenting with tooth pain. The history is provided by the patient and medical records.  Dental Pain   30 y.o. F with hx of fibromyalgia, presenting to the ED for dental pain.  Patient states this is been bothering her for the past several days. She went home to [facility] yesterday and was seen in the ER there. She was started on clindamycin which she has been taking as directed. She states they also prescribed her naproxen but this does not seem to be helping with pain. She reports she has some increased swelling to her right lower gums. She denies any difficult swallowing. No shortness of breath. No fever or chills. Patient has called local dentist ([facility]) and is hoping they can see her on Monday for emergency visit.  VSS.  Past Medical History  Diagnosis Date  . Fibromyalgia    History reviewed. No pertinent past surgical history. History reviewed. No pertinent family history. Social History  Substance Use Topics  . Smoking status: Never Smoker   . Smokeless tobacco: None  . Alcohol Use: No   OB History    No data available     Review of Systems  HENT: Positive for dental problem.   All other systems reviewed and are negative.     Allergies  Butane; Penicillins; Tramadol; and Vicodin  Home Medications   Prior to Admission medications   Medication Sig Start Date End Date Taking? Authorizing Provider  amphetamine-dextroamphetamine (ADDERALL) 20 MG tablet Take 20 mg by mouth daily as needed (adhd).  02/12/16   Historical Provider, MD  clindamycin (CLEOCIN) 300 MG capsule Take 1 capsule (300 mg total) by mouth 3 (three) times daily. Patient not taking:  Reported on 03/24/2016 01/17/16   Charlestine Night, PA-C  clonazePAM (KLONOPIN) 1 MG tablet Take 1 mg by mouth 2 (two) times daily as needed for anxiety.  03/13/16   Historical Provider, MD  cyclobenzaprine (FLEXERIL) 10 MG tablet Take 1 tablet (10 mg total) by mouth 2 (two) times daily as needed for muscle spasms. Patient not taking: Reported on 03/24/2016 09/22/15   Danelle Berry, PA-C  ibuprofen (ADVIL,MOTRIN) 800 MG tablet Take 1 tablet (800 mg total) by mouth every 8 (eight) hours as needed. 01/17/16   Charlestine Night, PA-C  meloxicam (MOBIC) 7.5 MG tablet Take 2 tablets (15 mg total) by mouth daily. 03/24/16   Joycie Peek, PA-C  Multiple Vitamins-Minerals (MULTIVITAMIN & MINERAL PO) Take 1 tablet by mouth daily.    Historical Provider, MD  omeprazole (PRILOSEC) 20 MG capsule Take 20 mg by mouth daily as needed (heartburn).  03/23/16   Historical Provider, MD  oxyCODONE-acetaminophen (PERCOCET/ROXICET) 5-325 MG tablet Take 1 tablet by mouth every 6 (six) hours as needed for severe pain. Patient not taking: Reported on 03/24/2016 01/17/16   Charlestine Night, PA-C  predniSONE (DELTASONE) 20 MG tablet Take 40 mg by mouth daily for 3 days, then  by mouth daily for 3 days, then  daily for 3 days Patient not taking: Reported on 03/24/2016 12/03/15   Garlon Hatchet, PA-C  propranolol ER (INDERAL LA) 60 MG 24 hr capsule Take 60 mg by mouth daily.  02/25/16   Historical Provider, MD  topiramate (  TOPAMAX) 100 MG tablet Take 100-200 mg by mouth 2 (two) times daily. Take 1 tablet (100 mg) in the morning and Take 2 tablets (200 mg) at bedtime. 03/23/16   Historical Provider, MD  valACYclovir (VALTREX) 500 MG tablet Take 500 mg by mouth daily.  02/25/16   Historical Provider, MD  zolpidem (AMBIEN) 10 MG tablet Take 10 mg by mouth at bedtime as needed for sleep.  02/25/16   Historical Provider, MD   BP 139/89 mmHg  Pulse 60  Temp(Src) 98.1 F (36.7 C) (Oral)  Resp 16  SpO2 100%   Physical Exam   Constitutional: She is oriented to person, place, and time. She appears well-developed and well-nourished. No distress.  No distress  HENT:  Head: Normocephalic and atraumatic.  Mouth/Throat: Oropharynx is clear and moist.  Teeth largely in poor dentition, right lower first molar is missing with gingival swelling noted, no definitive abscess, handling secretions appropriately, no trismus, mild swelling of right cheek without extension into neck; normal phonation, no stridor  Eyes: Conjunctivae and EOM are normal. Pupils are equal, round, and reactive to light.  Neck: Normal range of motion. Neck supple.  Cardiovascular: Normal rate, regular rhythm and normal heart sounds.   Pulmonary/Chest: Effort normal and breath sounds normal. No respiratory distress. She has no wheezes.  Abdominal: Soft. Bowel sounds are normal. There is no tenderness. There is no guarding.  Musculoskeletal: Normal range of motion. She exhibits no edema.  Neurological: She is alert and oriented to person, place, and time.  Skin: Skin is warm. She is not diaphoretic.  Psychiatric: She has a normal mood and affect.  Nursing note and vitals reviewed.   ED Course  Procedures (including critical care time) Labs Review Labs Reviewed - No data to display  Imaging Review No results found. I have personally reviewed and evaluated these images and lab results as part of my medical decision-making.   EKG Interpretation None      MDM   Final diagnoses:  Dental infection   30 year old female here with dental pain. She likely has developing dental abscess. No clinical signs or symptoms concerning for Ludwig's angina at this time. Her vital signs are stable. She started clindamycin for this yesterday, will have her continue this.  She is requesting something stronger for pain, discussed with her the hospital policy states that we do not use narcotics for dental pain. She may take Motrin 800. She was given referral to  on-call dentist as well as dental resource guide in which to follow-up.  Discussed plan with patient, he/she acknowledged understanding and agreed with plan of care.  Return precautions given for new or worsening symptoms.  Garlon HatchetLisa M Arlett Goold, PA-C 04/26/16 [id number]  Lorre NickAnthony Tweed, MD 04/29/16 [phone]

## 2016-04-26 NOTE — Discharge Instructions (Signed)
Take the prescribed medication as directed. Follow-up with dentist.  You may call dentist on call or one of the clinics listed below or you may find your own. Return to the ED for new or worsening symptoms.  [employer] Resource Guide Dental The [employer] 211 is a great source of information about community services available.  Access by dialing 2-1-1 from anywhere in [place], or by website -  PooledIncome.plwww.nc211.org.   Other Local Resources (Updated 10/2015)  Dental  Care   Services    Phone Number and Address  Cost  [facility] For children 640 - 30 years of age:   Cleaning  Tooth brushing/flossing instruction  Sealants, fillings, crowns  Extractions  Emergency treatment  [phone] [address] [place], [place] [address] Charges based on family income.  Medicaid and some insurance plans accepted.     [facility]  Cleaning  Sealants, fillings, crowns  Extractions  Emergency treatment [phone] [address] [place], [place]  Pregnant women 30 years of age or older with a Medicaid card  [facility] [place]  Cleaning  Sealants, fillings, crowns  Extractions  Emergency treatment [phone] [address] [place], [place] Pregnant women 30 years of age or older with a Medicaid card  [facility] For children 10 - 30 years of age:   Cleaning  Tooth brushing/flossing instruction  Sealants, fillings, crowns  Extractions  Emergency treatment Limited orthodontic services for patients with Medicaid [phone] [address] [place], [place] [address] Medicaid and [facility] Health Choice cover for children up to age 30 and pregnant women.  Parents of children up to age 30 without Medicaid pay a reduced fee at time of service.  [facility] [place] For  children 530 - 30 years of age:   Cleaning  Tooth brushing/flossing instruction  Sealants, fillings, crowns  Extractions  Emergency treatment Limited orthodontic services for patients with Medicaid [phone] [address] [place], [place].  Medicaid and [employer] Health Choice cover for children up to age 30 and pregnant women.  Parents of children up to age 30 without Medicaid pay a reduced fee.  [facility]  Cleaning  Sealants, fillings, crowns  Extractions  Hours: Tuesdays and Thursdays, 4:15 - 8 pm [phone] [address] [place], [place] [address] Services free of charge to [facility] residents ages 18-64 who do not have health insurance, Medicare, [place], or [place] benefits and fall within federal poverty guidelines  [facility]    Provides dental care in addition to primary medical care, nutritional counseling, and pharmacy:  Nurse, mental healthCleaning  Sealants, fillings, crowns  Extractions                  [phone] [facility], [address] [place], [place]  [phone] [facility], [address] [place], [place]  [phone] [facility] [place], [place]  [phone] [facility], [address] [place], [place]  [phone] [facility] [address] [place], [place] Accepts [place], [place]icare, most insurance.  Also provides services available to all with fees adjusted based on ability to pay.    [facility]  Cleaning  Tooth brushing/flossing instruction  Sealants, fillings, crowns  Extractions  Emergency treatment Hours:  Tuesdays, Thursdays, and Fridays from 8 am to 5 pm by appointment only. [phone] [address] [place], [place] [address] [facility] residents with Medicaid (depending on eligibility) and children with [place]  Health Choice - call for more information.  [facility]  Extractions only  Hours: 2nd and 4th Thursday of each month from 6:30 am - 9 am.   [phone] [place], [place] [address] Ages 15 and older only.  Patients are seen on a first come, first served basis.  [employer]  [employer]  Extractions  Orthodontics  Endodontics  Implants/Crowns/Bridges  Complete and partial dentures [phone] [place], [place] Patients must complete an application for services.  There is often a waiting list.

## 2016-04-26 NOTE — ED Notes (Signed)
IV team in w/pt.

## 2016-04-26 NOTE — ED Notes (Signed)
States friend is driving her from ED. Voices understanding of no driving if given narcotic pain med.

## 2016-04-26 NOTE — ED Provider Notes (Signed)
Hand-off from A. Harris, PA-C. Pending consult from dentistry. Plan to have pt follow up with dentist outpatient tomorrow for further evaluation and I&D.  Briefly patient is a 30 year old female who presents to the ED with complaint of worsening right lower dental pain that started 3 days ago. She notes she was seen in the ED in [place] 2 days ago, was prescribed Clindamycin and notes she has been taking it as prescribed. She notes her symptoms have continued and reports having worsening facial swelling today. Denies difficulty swallowing, difficulty breathing, sore throat, fever, chills, neck stiffness.  VSS. Exam performed by initial provider revealed poor dentition, right lower first molar missing with gingival swelling noted, no definitive abscess, patient handling secretions, no trismus, mild swelling of right cheek without extension into neck. No stridor, airway patent.  Patient given pain meds in the ED. CT soft tissue neck revealed periapical lucency surrounding residual right mandibular molar, inflammatory changes to right lateral mandible with subcutaneous stranding down to platysmas muscle. Dr. Leanord AsalFarless advised to have patient call his clinic in the morning to establish an appointment to be seen for further management of her periapical abscess. Advised to have patient continue taking clindamycin as prescribed. Plan to discharge patient home with pain meds and symptomatic treatment. Discussed return precautions with patient.   Sylvia Sullivan, [place] JerseyPA-C 04/26/16 1701  Vanetta MuldersScott Zackowski, MD 04/27/16 [phone]

## 2016-04-27 MED ORDER — IOPAMIDOL (ISOVUE-300) INJECTION 61%
75.0000 mL | Freq: Once | INTRAVENOUS | Status: AC | PRN
  Administered 2016-04-26: 75 mL via INTRAVENOUS

## 2016-07-29 ENCOUNTER — Ambulatory Visit (INDEPENDENT_AMBULATORY_CARE_PROVIDER_SITE_OTHER): Payer: Medicare Other | Admitting: Orthopedic Surgery

## 2016-07-29 DIAGNOSIS — M25512 Pain in left shoulder: Secondary | ICD-10-CM

## 2016-08-27 ENCOUNTER — Telehealth (INDEPENDENT_AMBULATORY_CARE_PROVIDER_SITE_OTHER): Payer: Self-pay | Admitting: Orthopedic Surgery

## 2016-08-27 NOTE — Telephone Encounter (Signed)
Rx refill, pls advise

## 2016-08-27 NOTE — Telephone Encounter (Signed)
Patient requesting refill on the oxycodone.  Patient can pick up when called.  cb [phone]

## 2016-08-27 NOTE — Telephone Encounter (Signed)
IC pt and advised no refill oxycodone, she understands.  She says she left you a voice mail about when her MRI will be scheduled.  Can you f/u on MRI?  thanks

## 2016-08-27 NOTE — Telephone Encounter (Signed)
No rf oxy pls clal thx

## 2016-08-28 NOTE — Telephone Encounter (Signed)
I called pt back this am and no answer, there is no order in the work q for MRI.

## 2016-08-31 ENCOUNTER — Telehealth (INDEPENDENT_AMBULATORY_CARE_PROVIDER_SITE_OTHER): Payer: Self-pay | Admitting: *Deleted

## 2016-08-31 NOTE — Telephone Encounter (Signed)
y mri arthrogram thx

## 2016-08-31 NOTE — Telephone Encounter (Signed)
See note- your last office note states 4 week return and scan if no better.  Pls advise ordering scan vs. appt with you?

## 2016-08-31 NOTE — Telephone Encounter (Signed)
Pt called stating that Dr. August Saucerean advised her that if her L shoulder wasn't better then he would set up MRI L shoulder. I [facility], her last visit was Oct 18, 17. Ok to go ahead with MRI L shoulder?

## 2016-09-01 ENCOUNTER — Other Ambulatory Visit (INDEPENDENT_AMBULATORY_CARE_PROVIDER_SITE_OTHER): Payer: Self-pay | Admitting: Orthopedic Surgery

## 2016-09-01 DIAGNOSIS — G8929 Other chronic pain: Secondary | ICD-10-CM

## 2016-09-01 DIAGNOSIS — M25512 Pain in left shoulder: Principal | ICD-10-CM

## 2016-09-01 NOTE — Telephone Encounter (Signed)
MRI ordered and in referral work q

## 2016-09-28 ENCOUNTER — Ambulatory Visit
Admission: RE | Admit: 2016-09-28 | Discharge: 2016-09-28 | Disposition: A | Discharge status: Home / Self Care | Payer: Medicare Other | Source: Ambulatory Visit | Attending: Orthopedic Surgery | Admitting: Orthopedic Surgery

## 2016-09-28 DIAGNOSIS — G8929 Other chronic pain: Secondary | ICD-10-CM

## 2016-09-28 DIAGNOSIS — M25512 Pain in left shoulder: Principal | ICD-10-CM

## 2016-09-28 MED ORDER — IOPAMIDOL (ISOVUE-M 200) INJECTION 41%
15.0000 mL | Freq: Once | INTRAMUSCULAR | Status: AC
  Administered 2016-09-28: 15 mL via INTRA_ARTICULAR

## 2016-09-30 ENCOUNTER — Telehealth (INDEPENDENT_AMBULATORY_CARE_PROVIDER_SITE_OTHER): Payer: Self-pay | Admitting: Orthopedic Surgery

## 2016-09-30 MED ORDER — ACETAMINOPHEN-CODEINE #3 300-30 MG PO TABS: 1.0000 | ORAL_TABLET | Freq: Two times a day (BID) | ORAL | 0 refills | Status: AC | PRN

## 2016-09-30 NOTE — Telephone Encounter (Signed)
Okay for Tylenol with codeine 1 by mouth every 12 hours when necessary pain #35 with no refills

## 2016-09-30 NOTE — Telephone Encounter (Signed)
Okay for tramadol 1 by mouth every 8 hours when necessary pain #30

## 2016-09-30 NOTE — Telephone Encounter (Signed)
Patient is allergic to tramadol and to hydrocodone, pls advise on Rx?

## 2016-09-30 NOTE — Telephone Encounter (Signed)
IC patient NA,  I have sent a Rx in to her pharm. If pt calls, please advise her, thanks

## 2016-09-30 NOTE — Telephone Encounter (Signed)
Patient request something for pain, until she can come for her appoitnment on 10/22/16. Pt could not r/s appt due to the fact she needs a late afternoon appt due to work.  [facility]

## 2016-09-30 NOTE — Telephone Encounter (Signed)
Rx request, see note, MRI report is in chart already for review, pls advise on meds?  thanks

## 2016-10-01 ENCOUNTER — Ambulatory Visit (INDEPENDENT_AMBULATORY_CARE_PROVIDER_SITE_OTHER): Payer: Medicare Other | Admitting: Orthopedic Surgery

## 2016-10-09 ENCOUNTER — Ambulatory Visit (INDEPENDENT_AMBULATORY_CARE_PROVIDER_SITE_OTHER): Payer: Medicare Other | Admitting: Orthopedic Surgery

## 2016-10-22 ENCOUNTER — Telehealth (INDEPENDENT_AMBULATORY_CARE_PROVIDER_SITE_OTHER): Payer: Self-pay | Admitting: *Deleted

## 2016-10-22 ENCOUNTER — Ambulatory Visit (INDEPENDENT_AMBULATORY_CARE_PROVIDER_SITE_OTHER): Payer: Medicare Other | Admitting: Orthopedic Surgery

## 2016-10-22 NOTE — Telephone Encounter (Signed)
Pt called had to reschedule MRI review, pt stated she is in pain and would like something called in until her next appt or if there are any results she can be given

## 2016-10-23 NOTE — Telephone Encounter (Signed)
Please call his patient.  I reviewed her scan.  Rotator cuff is okay.  Nothing operative in the shoulder.  There's been no structural damage to her shoulder which will require surgery.  I think she is okay for tramadol but I wouldn't go any stronger at this time.  Please call him tramadol 1 by mouth every 12 hours when necessary pain #30 with no refills

## 2016-10-23 NOTE — Telephone Encounter (Signed)
Holding for you, pls advise.

## 2016-10-23 NOTE — Telephone Encounter (Signed)
Patient requests oxycodone. She states the tramadol gives her rash and she has tylenol #3 from when you gave before

## 2016-10-26 ENCOUNTER — Telehealth (INDEPENDENT_AMBULATORY_CARE_PROVIDER_SITE_OTHER): Payer: Self-pay | Admitting: Orthopedic Surgery

## 2016-10-26 NOTE — Telephone Encounter (Signed)
IC pt and LMVM advising 

## 2016-10-26 NOTE — Telephone Encounter (Signed)
See note regarding Rx. Please advise.

## 2016-10-26 NOTE — Telephone Encounter (Signed)
No to the Percocet she may need to be referred to pain management but she has a chronic problem in the shoulder and should not be be treating it with opioids at this point

## 2016-11-04 ENCOUNTER — Ambulatory Visit (INDEPENDENT_AMBULATORY_CARE_PROVIDER_SITE_OTHER): Payer: Medicare Other | Admitting: Orthopedic Surgery

## 2016-11-06 ENCOUNTER — Emergency Department (HOSPITAL_COMMUNITY)
Admission: EM | Admit: 2016-11-06 | Discharge: 2016-11-07 | Disposition: A | Discharge status: Home / Self Care | Payer: Medicare Other | Attending: Emergency Medicine | Admitting: Emergency Medicine

## 2016-11-06 ENCOUNTER — Encounter (HOSPITAL_COMMUNITY): Payer: Self-pay | Admitting: *Deleted

## 2016-11-06 DIAGNOSIS — R42 Dizziness and giddiness: Secondary | ICD-10-CM | POA: Diagnosis not present

## 2016-11-06 DIAGNOSIS — G44209 Tension-type headache, unspecified, not intractable: Secondary | ICD-10-CM | POA: Diagnosis not present

## 2016-11-06 HISTORY — DX: Migraine, unspecified, not intractable, without status migrainosus: G43.909

## 2016-11-06 LAB — CBC WITH DIFFERENTIAL/PLATELET
Basophils Absolute: 0 10*3/uL (ref 0.0–0.1)
Basophils Relative: 0 %
EOS ABS: 0.2 10*3/uL (ref 0.0–0.7)
EOS PCT: 2 %
HEMATOCRIT: 40.3 % (ref 36.0–46.0)
HEMOGLOBIN: 13.5 g/dL (ref 12.0–15.0)
LYMPHS ABS: 4.7 10*3/uL — AB (ref 0.7–4.0)
LYMPHS PCT: 34 %
MCH: 28.9 pg (ref 26.0–34.0)
MCHC: 33.5 g/dL (ref 30.0–36.0)
MCV: 86.3 fL (ref 78.0–100.0)
Monocytes Absolute: 1.1 10*3/uL — ABNORMAL HIGH (ref 0.1–1.0)
Monocytes Relative: 8 %
Neutro Abs: 7.9 10*3/uL — ABNORMAL HIGH (ref 1.7–7.7)
Neutrophils Relative %: 56 %
Platelets: 300 10*3/uL (ref 150–400)
RBC: 4.67 MIL/uL (ref 3.87–5.11)
RDW: 13.1 % (ref 11.5–15.5)
WBC: 14.1 10*3/uL — AB (ref 4.0–10.5)

## 2016-11-06 LAB — BASIC METABOLIC PANEL
ANION GAP: 11 (ref 5–15)
BUN: 8 mg/dL (ref 6–20)
CHLORIDE: 106 mmol/L (ref 101–111)
CO2: 20 mmol/L — ABNORMAL LOW (ref 22–32)
Calcium: 9.4 mg/dL (ref 8.9–10.3)
Creatinine, Ser: 0.64 mg/dL (ref 0.44–1.00)
GFR calc non Af Amer: >60 mL/min (ref >60)
Glucose, Bld: 98 mg/dL (ref 65–99)
Potassium: 3.9 mmol/L (ref 3.5–5.1)
Sodium: 137 mmol/L (ref 135–145)

## 2016-11-06 MED ORDER — SODIUM CHLORIDE 0.9 % IV BOLUS (SEPSIS)
1000.0000 mL | Freq: Once | INTRAVENOUS | Status: AC
  Administered 2016-11-06: 1000 mL via INTRAVENOUS

## 2016-11-06 NOTE — ED Provider Notes (Signed)
[facility]-EMERGENCY DEPT Provider Note   CSN: [id number] Arrival date & time: 11/06/16  1845     History   Chief Complaint Chief Complaint  Patient presents with  . Dizziness    HPI Sylvia Sullivan is a 31 y.o. female.  Patient with history of migraines presents with complaint of dizziness x 3 days associated with nausea and vomiting. Dizziness is present at rest but worse with movement. No fever, ear pain or tinnitus. She reports a history of "low iron" but has never been required to take supplements or receive a transfusion. She reports a headache that goes from the neck to the forehead bilaterally, unlike her migraine history. No chest pain, SOB, cough, congestion or sore throat.    The history is provided by the patient. No language interpreter was used.  Dizziness  Associated symptoms: headaches, nausea and vomiting   Associated symptoms: no chest pain, no shortness of breath and no tinnitus     Past Medical History:  Diagnosis Date  . Fibromyalgia   . Migraines     There are no active problems to display for this patient.   Past Surgical History:  Procedure Laterality Date  . CHOLECYSTECTOMY    . KNEE ARTHROSCOPY Bilateral   . ROTATOR CUFF REPAIR Bilateral   . TONSILLECTOMY    . TUBAL LIGATION      OB History    No data available       Home Medications    Prior to Admission medications   Medication Sig Start Date End Date Taking? Authorizing Provider  acetaminophen-codeine (TYLENOL #3) 300-30 MG tablet Take 1 tablet by mouth every 12 (twelve) hours as needed for moderate pain. 09/30/16   Cammy Copa, MD  amphetamine-dextroamphetamine (ADDERALL) 20 MG tablet Take 20 mg by mouth daily as needed (adhd).  02/12/16   Historical Provider, MD  clindamycin (CLEOCIN) 300 MG capsule Take 1 capsule (300 mg total) by mouth 3 (three) times daily. Patient not taking: Reported on 03/24/2016 01/17/16   Charlestine Night, PA-C  clonazePAM (KLONOPIN) 1 MG tablet Take 1  mg by mouth 2 (two) times daily as needed for anxiety.  03/13/16   Historical Provider, MD  cyclobenzaprine (FLEXERIL) 10 MG tablet Take 1 tablet (10 mg total) by mouth 2 (two) times daily as needed for muscle spasms. Patient not taking: Reported on 03/24/2016 09/22/15   Danelle Berry, PA-C  ibuprofen (ADVIL,MOTRIN) 800 MG tablet Take 1 tablet (800 mg total) by mouth 3 (three) times daily. 04/26/16   Garlon Hatchet, PA-C  meloxicam (MOBIC) 7.5 MG tablet Take 2 tablets (15 mg total) by mouth daily. 03/24/16   Joycie Peek, PA-C  Multiple Vitamins-Minerals (MULTIVITAMIN & MINERAL PO) Take 1 tablet by mouth daily.    Historical Provider, MD  omeprazole (PRILOSEC) 20 MG capsule Take 20 mg by mouth daily as needed (heartburn).  03/23/16   Historical Provider, MD  oxyCODONE-acetaminophen (PERCOCET) 5-325 MG tablet Take 1-2 tablets by mouth every 4 (four) hours as needed. 04/26/16   Arthor Captain, PA-C  predniSONE (DELTASONE) 20 MG tablet Take 40 mg by mouth daily for 3 days, then 20mg  by mouth daily for 3 days, then 10mg  daily for 3 days Patient not taking: Reported on 03/24/2016 12/03/15   Garlon Hatchet, PA-C  propranolol ER (INDERAL LA) 60 MG 24 hr capsule Take 60 mg by mouth daily.  02/25/16   Historical Provider, MD  topiramate (TOPAMAX) 100 MG tablet Take 100-200 mg by mouth 2 (two) times daily. Take  1 tablet (100 mg) in the morning and Take 2 tablets (200 mg) at bedtime. 03/23/16   Historical Provider, MD  valACYclovir (VALTREX) 500 MG tablet Take 500 mg by mouth daily.  02/25/16   Historical Provider, MD  zolpidem (AMBIEN) 10 MG tablet Take 10 mg by mouth at bedtime as needed for sleep.  02/25/16   Historical Provider, MD    Family History No family history on file.  Social History Social History  Substance Use Topics  . Smoking status: Never Smoker  . Smokeless tobacco: Never Used  . Alcohol use No     Allergies   Butane; Penicillins; Tramadol; and Vicodin  [hydrocodone-acetaminophen]   Review of Systems Review of Systems  Constitutional: Positive for appetite change and fatigue. Negative for chills and fever.  HENT: Negative.  Negative for congestion, sore throat and tinnitus.   Eyes: Positive for visual disturbance (Some blurry vision).  Respiratory: Negative.  Negative for cough and shortness of breath.   Cardiovascular: Negative.  Negative for chest pain.  Gastrointestinal: Positive for nausea and vomiting. Negative for abdominal pain.  Genitourinary: Negative for dysuria, menstrual problem, vaginal bleeding and vaginal discharge.  Musculoskeletal: Positive for neck pain.  Skin: Negative.  Negative for rash.  Neurological: Positive for dizziness and headaches. Negative for syncope.     Physical Exam Updated Vital Signs BP (!) 154/102 (BP Location: Left Arm)   Pulse 61   Temp 98 F (36.7 C) (Oral)   Resp 14   Ht 5\' 2"  (1.575 m)   Wt 77.1 kg   LMP 10/17/2016 (Approximate)   SpO2 100%   BMI 31.09 kg/m   Physical Exam  Constitutional: She is oriented to person, place, and time. She appears well-developed and well-nourished.  HENT:  Head: Normocephalic.  Eyes: Pupils are equal, round, and reactive to light.  Neck: Normal range of motion. Neck supple.  Cardiovascular: Normal rate and regular rhythm.   Pulmonary/Chest: Effort normal and breath sounds normal.  Abdominal: Soft. Bowel sounds are normal. There is no tenderness. There is no rebound and no guarding.  Musculoskeletal: Normal range of motion.  Cervical and paracervical tenderness that radiates to bilateral frontal region. Scalp tender to palpation.  Neurological: She is alert and oriented to person, place, and time. She has normal strength and normal reflexes. No sensory deficit. She displays a negative Romberg sign. Coordination normal.  CN's 3-12 grossly intact. Coordination full and without deficit. Speech clear and focused. No facial asymmetry. Negative romberg and  no pronator drift.   Skin: Skin is warm and dry. No rash noted.  Psychiatric: She has a normal mood and affect.     ED Treatments / Results  Labs (all labs ordered are listed, but only abnormal results are displayed) Labs Reviewed  CBC WITH DIFFERENTIAL/PLATELET  BASIC METABOLIC PANEL  URINALYSIS, ROUTINE W REFLEX MICROSCOPIC  PREGNANCY, URINE    EKG  EKG Interpretation None       Radiology No results found.  Procedures Procedures (including critical care time)  Medications Ordered in ED Medications  sodium chloride 0.9 % bolus 1,000 mL (not administered)     Initial Impression / Assessment and Plan / ED Course  I have reviewed the triage vital signs and the nursing notes.  Pertinent labs & imaging results that were available during my care of the patient were reviewed by me and considered in my medical decision making (see chart for details).     1. Dizziness 2. Tension type headache  Lab evaluation  for dizziness is essentially negative. Not pregnant. She is very well appearing. Not orthostatic.  Will treat with Meclizine for vertiginous symptoms and encourage PCP follow up. She is felt stable for discharge home.  Final Clinical Impressions(s) / ED Diagnoses   Final diagnoses:  None    New Prescriptions New Prescriptions   No medications on file     Elpidio Anis, PA-C 11/09/16 0001    Arby Barrette, MD 11/09/16 1616

## 2016-11-06 NOTE — ED Triage Notes (Addendum)
Pt c/o dizziness, fatigue, nausea, & tingling of L 2nd & 3rd finger, & blurred vision worse on the L eye, pt reports onset x3 days, pt denies CP, SOB, v/d, pt hx of migraines, pt A&O x4

## 2016-11-07 DIAGNOSIS — R42 Dizziness and giddiness: Secondary | ICD-10-CM | POA: Diagnosis not present

## 2016-11-07 LAB — PREGNANCY, URINE: PREG TEST UR: NEGATIVE

## 2016-11-07 LAB — URINALYSIS, ROUTINE W REFLEX MICROSCOPIC
Bilirubin Urine: NEGATIVE
Glucose, UA: NEGATIVE mg/dL
Hgb urine dipstick: NEGATIVE
Ketones, ur: NEGATIVE mg/dL
LEUKOCYTES UA: NEGATIVE
NITRITE: NEGATIVE
PH: 7 (ref 5.0–8.0)
Protein, ur: NEGATIVE mg/dL
SPECIFIC GRAVITY, URINE: 1.011 (ref 1.005–1.030)

## 2016-11-07 MED ORDER — MECLIZINE HCL 25 MG PO TABS
25.0000 mg | ORAL_TABLET | Freq: Once | ORAL | Status: AC
  Administered 2016-11-07: 25 mg via ORAL
  Filled 2016-11-07: qty 1

## 2016-11-07 MED ORDER — MECLIZINE HCL 25 MG PO TABS: 25.0000 mg | ORAL_TABLET | Freq: Three times a day (TID) | ORAL | 0 refills | Status: AC | PRN

## 2017-08-12 ENCOUNTER — Ambulatory Visit (INDEPENDENT_AMBULATORY_CARE_PROVIDER_SITE_OTHER): Payer: Medicare Other | Admitting: Orthopedic Surgery

## 2017-08-12 ENCOUNTER — Encounter (INDEPENDENT_AMBULATORY_CARE_PROVIDER_SITE_OTHER): Payer: Self-pay | Admitting: Orthopedic Surgery

## 2017-08-12 ENCOUNTER — Ambulatory Visit (INDEPENDENT_AMBULATORY_CARE_PROVIDER_SITE_OTHER): Payer: Medicare Other

## 2017-08-12 DIAGNOSIS — M25562 Pain in left knee: Secondary | ICD-10-CM | POA: Diagnosis not present

## 2017-08-12 DIAGNOSIS — M25572 Pain in left ankle and joints of left foot: Secondary | ICD-10-CM | POA: Diagnosis not present

## 2017-08-12 DIAGNOSIS — M25512 Pain in left shoulder: Secondary | ICD-10-CM

## 2017-08-12 DIAGNOSIS — G8929 Other chronic pain: Secondary | ICD-10-CM | POA: Diagnosis not present

## 2017-08-16 ENCOUNTER — Telehealth (INDEPENDENT_AMBULATORY_CARE_PROVIDER_SITE_OTHER): Payer: Self-pay | Admitting: Orthopedic Surgery

## 2017-08-16 DIAGNOSIS — M25512 Pain in left shoulder: Secondary | ICD-10-CM

## 2017-08-16 DIAGNOSIS — M25562 Pain in left knee: Secondary | ICD-10-CM | POA: Diagnosis not present

## 2017-08-16 MED ORDER — BUPIVACAINE HCL 0.5 % IJ SOLN
9.0000 mL | INTRAMUSCULAR | Status: AC | PRN
  Administered 2017-08-16: 9 mL via INTRA_ARTICULAR

## 2017-08-16 MED ORDER — LIDOCAINE HCL 1 % IJ SOLN
5.0000 mL | INTRAMUSCULAR | Status: AC | PRN
  Administered 2017-08-16: 5 mL

## 2017-08-16 MED ORDER — METHYLPREDNISOLONE ACETATE 40 MG/ML IJ SUSP
40.0000 mg | INTRAMUSCULAR | Status: AC | PRN
  Administered 2017-08-16: 40 mg via INTRA_ARTICULAR

## 2017-08-16 MED ORDER — BUPIVACAINE HCL 0.25 % IJ SOLN
4.0000 mL | INTRAMUSCULAR | Status: AC | PRN
  Administered 2017-08-16: 4 mL via INTRA_ARTICULAR

## 2017-08-16 NOTE — Progress Notes (Signed)
Office Visit Note   Patient: Sylvia Sullivan           Date of Birth: 1985/11/26           MRN: [id number] Visit Date: 08/12/2017 Requested by: No referring provider defined for this encounter. PCP: Patient, No Pcp Per  Subjective: Chief Complaint  Patient presents with  . Left Shoulder - Pain  . Left Knee - Pain  . Left Ankle - Pain    HPI: Patient is a 31 year old female with left knee pain.  She's been playing soccer with her son for several weeks.  She denies any discrete injury but reports weakness and swelling in the left knee.  Denies any mechanical symptoms.  She feels like she may have rolled her left ankle.  She has not done this before.  She describes also continued pain in the left shoulder with history of surgery on the shoulder 5 times in the past which is been well documented.  She has had injection in the shoulder in the past and has helped.  Denies any injury to the shoulder.  Just reports pain and no weakness or mechanical symptoms.  She has been icing and she tried Some soaks and anti-inflammatories hoping that it would improve.  The pain in her knee is worse with walking.  She does have a history of arthroscopy and "plica" excision from the left knee.              ROS: All systems reviewed are negative as they relate to the chief complaint within the history of present illness.  Patient denies  fevers or chills.   Assessment & Plan: Visit Diagnoses:  1. Pain in left ankle and joints of left foot   2. Acute pain of left knee   3. Chronic left shoulder pain     Plan: Impression is left knee pain with normal ligaments examination no joint line tenderness no effusion and normal x-rays.  Plan is to try an injection in that left knee and in case she's had some inflammation and will also try her with a open patella knee sleeve.  In regard to that ankle we will put her in an ASO for the ankle for 2-3 weeks.  Ankle feels pretty stable on exam.  We will also do an injection  in the subacromial space in the shoulder.  I will see her back as needed  Follow-Up Instructions: Return if symptoms worsen or fail to improve.   Orders:  Orders Placed This Encounter  Procedures  . XR KNEE 3 VIEW LEFT  . XR Ankle Complete Left   No orders of the defined types were placed in this encounter.     Procedures: Large Joint Inj: L knee on 08/16/2017 7:13 PM Indications: diagnostic evaluation, joint swelling and pain Details: 18 G 1.5 in needle, superolateral approach  Arthrogram: No  Medications: 4 mL bupivacaine 0.25 %; 5 mL lidocaine 1 %; 40 mg methylPREDNISolone acetate 40 MG/ML Outcome: tolerated well, no immediate complications Procedure, treatment alternatives, risks and benefits explained, specific risks discussed. Consent was given by the patient. Immediately prior to procedure a time out was called to verify the correct patient, procedure, equipment, support staff and site/side marked as required. Patient was prepped and draped in the usual sterile fashion.   Large Joint Inj: L subacromial bursa on 08/16/2017 8:07 PM Indications: diagnostic evaluation and pain Details: 18 G 1.5 in needle, posterior approach  Arthrogram: No  Medications: 9 mL bupivacaine 0.5 %;  40 mg methylPREDNISolone acetate 40 MG/ML; 5 mL lidocaine 1 % Outcome: tolerated well, no immediate complications Procedure, treatment alternatives, risks and benefits explained, specific risks discussed. Consent was given by the patient. Immediately prior to procedure a time out was called to verify the correct patient, procedure, equipment, support staff and site/side marked as required. Patient was prepped and draped in the usual sterile fashion.       Clinical Data: No additional findings.  Objective: Vital Signs: There were no vitals taken for this visit.  Physical Exam:   Constitutional: Patient appears well-developed HEENT:  Head: Normocephalic Eyes:EOM are normal Neck: Normal range of  motion Cardiovascular: Normal rate Pulmonary/chest: Effort normal Neurologic: Patient is alert Skin: Skin is warm Psychiatric: Patient has normal mood and affect    Ortho Exam: Orthopedic exam demonstrates good cervical spine range of motion.  Patient has multiple incisions around the left shoulder.  Passive range of motion maintained.  No bit stiff at the end ranges.  Negative apprehension relocation testing.  Good rotator cuff strength.  Positive impingement signs.  No discrete acromioclavicular joint tenderness left versus right.  Left knee exam demonstrates full active and passive range of motion mild periretinacular tenderness stable collateral cruciate ligaments no medial or lateral joint line tenderness palpable pedal pulses and no groin pain with internal/external rotation of the leg  Left ankle is examined and demonstrates palpable intact nontender anterior to posterior tib peroneal and Achilles tendon.  Patient has mild tenderness over the ATFL but no swelling or bruising is noted.  Ankle stability is symmetric to anterior drawer testing and talar tilt testing left versus right ankle.  Specialty Comments:  No specialty comments available.  Imaging: No results found.   PMFS History: There are no active problems to display for this patient.  Past Medical History:  Diagnosis Date  . Fibromyalgia   . Migraines     No family history on file.  Past Surgical History:  Procedure Laterality Date  . CHOLECYSTECTOMY    . KNEE ARTHROSCOPY Bilateral   . ROTATOR CUFF REPAIR Bilateral   . TONSILLECTOMY    . TUBAL LIGATION     Social History   Occupational History  . Not on file  Tobacco Use  . Smoking status: Never Smoker  . Smokeless tobacco: Never Used  Substance and Sexual Activity  . Alcohol use: No  . Drug use: No  . Sexual activity: Not on file

## 2017-08-16 NOTE — Telephone Encounter (Signed)
Ok for mobic 15 po qd # 45 prn pls clal thx

## 2017-08-16 NOTE — Telephone Encounter (Signed)
Please advise. Was patient supposed to receive a medication/rx at her OV last week? Please advise. Thanks.

## 2017-08-16 NOTE — Telephone Encounter (Signed)
Pt is waiting for med for inflammation

## 2017-08-17 MED ORDER — MELOXICAM 15 MG PO TABS: ORAL_TABLET | ORAL | 0 refills | Status: AC

## 2017-08-17 NOTE — Telephone Encounter (Signed)
Sent to pharmacy. Patient advised done.  

## 2017-09-06 ENCOUNTER — Telehealth (INDEPENDENT_AMBULATORY_CARE_PROVIDER_SITE_OTHER): Payer: Self-pay | Admitting: Orthopedic Surgery

## 2017-09-06 NOTE — Telephone Encounter (Signed)
y

## 2017-09-06 NOTE — Telephone Encounter (Signed)
Note faxed. Left ASO at front desk for her to pick up . She will  Sign [employer] form at front desk.

## 2017-09-06 NOTE — Telephone Encounter (Signed)
Ok for note for her to return to work?
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
## 2017-09-17 ENCOUNTER — Emergency Department (HOSPITAL_COMMUNITY)
Admission: EM | Admit: 2017-09-17 | Discharge: 2017-09-18 | Disposition: A | Discharge status: Home / Self Care | Payer: Medicare Other | Attending: Emergency Medicine | Admitting: Emergency Medicine

## 2017-09-17 ENCOUNTER — Encounter (HOSPITAL_COMMUNITY): Payer: Self-pay

## 2017-09-17 DIAGNOSIS — Y929 Unspecified place or not applicable: Secondary | ICD-10-CM | POA: Insufficient documentation

## 2017-09-17 DIAGNOSIS — T148XXA Other injury of unspecified body region, initial encounter: Secondary | ICD-10-CM

## 2017-09-17 DIAGNOSIS — Z79899 Other long term (current) drug therapy: Secondary | ICD-10-CM | POA: Insufficient documentation

## 2017-09-17 DIAGNOSIS — Y999 Unspecified external cause status: Secondary | ICD-10-CM | POA: Insufficient documentation

## 2017-09-17 DIAGNOSIS — S29012A Strain of muscle and tendon of back wall of thorax, initial encounter: Secondary | ICD-10-CM | POA: Diagnosis not present

## 2017-09-17 DIAGNOSIS — Y939 Activity, unspecified: Secondary | ICD-10-CM | POA: Insufficient documentation

## 2017-09-17 DIAGNOSIS — M549 Dorsalgia, unspecified: Secondary | ICD-10-CM | POA: Diagnosis present
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
## 2017-09-17 NOTE — ED Notes (Addendum)
Pt in Peds ED with child. Could not get vitals.

## 2017-09-18 MED ORDER — CYCLOBENZAPRINE HCL 5 MG PO TABS: 5.0000 mg | ORAL_TABLET | Freq: Every evening | ORAL | 0 refills | Status: AC | PRN

## 2017-09-18 NOTE — ED Notes (Signed)
Patient is A&Ox4.  No signs of distress noted.  Please see providers complete history and physical exam.  
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
## 2017-09-18 NOTE — Discharge Instructions (Signed)
Continue taking tylenol and ibuprofen for pain.  Use Flexeril as needed for muscle stiffness or soreness.  Have caution, this may make you tired or groggy.  Do not drive or operate heavy machinery while taking this medicine. Use ice packs or heating pads if this helps control your pain. Follow-up with primary care in 1 week if your symptoms are not improving. Return to the emergency room if you develop numbness, loss of bowel or bladder control, or any new or worsening symptoms.

## 2017-09-23 ENCOUNTER — Other Ambulatory Visit (INDEPENDENT_AMBULATORY_CARE_PROVIDER_SITE_OTHER): Payer: Self-pay | Admitting: Orthopedic Surgery

## 2017-09-23 NOTE — Telephone Encounter (Signed)
ok 

## 2017-09-23 NOTE — Telephone Encounter (Signed)
Ok to rf? 

## 2017-09-23 NOTE — Telephone Encounter (Signed)
y

## 2017-12-22 ENCOUNTER — Other Ambulatory Visit: Payer: Self-pay

## 2017-12-22 ENCOUNTER — Encounter (HOSPITAL_COMMUNITY): Payer: Self-pay | Admitting: Emergency Medicine

## 2017-12-22 ENCOUNTER — Emergency Department (HOSPITAL_COMMUNITY): Payer: Medicare Other

## 2017-12-22 DIAGNOSIS — R51 Headache: Secondary | ICD-10-CM | POA: Diagnosis not present

## 2017-12-22 DIAGNOSIS — Z5321 Procedure and treatment not carried out due to patient leaving prior to being seen by health care provider: Secondary | ICD-10-CM | POA: Diagnosis not present

## 2017-12-22 LAB — URINALYSIS, ROUTINE W REFLEX MICROSCOPIC
BILIRUBIN URINE: NEGATIVE
Glucose, UA: NEGATIVE mg/dL
HGB URINE DIPSTICK: NEGATIVE
KETONES UR: NEGATIVE mg/dL
Leukocytes, UA: NEGATIVE
Nitrite: NEGATIVE
PROTEIN: NEGATIVE mg/dL
Specific Gravity, Urine: 1.018 (ref 1.005–1.030)
pH: 7 (ref 5.0–8.0)

## 2017-12-22 LAB — BASIC METABOLIC PANEL
Anion gap: 11 (ref 5–15)
BUN: 7 mg/dL (ref 6–20)
CALCIUM: 8.8 mg/dL — AB (ref 8.9–10.3)
CO2: 19 mmol/L — ABNORMAL LOW (ref 22–32)
CREATININE: 0.81 mg/dL (ref 0.44–1.00)
Chloride: 106 mmol/L (ref 101–111)
GFR calc Af Amer: >60 mL/min (ref >60)
Glucose, Bld: 106 mg/dL — ABNORMAL HIGH (ref 65–99)
Potassium: 3.6 mmol/L (ref 3.5–5.1)
SODIUM: 136 mmol/L (ref 135–145)

## 2017-12-22 LAB — CBC
HEMATOCRIT: 39.6 % (ref 36.0–46.0)
HEMOGLOBIN: 13 g/dL (ref 12.0–15.0)
MCH: 28.8 pg (ref 26.0–34.0)
MCHC: 32.8 g/dL (ref 30.0–36.0)
MCV: 87.8 fL (ref 78.0–100.0)
PLATELETS: 331 10*3/uL (ref 150–400)
RBC: 4.51 MIL/uL (ref 3.87–5.11)
RDW: 13.9 % (ref 11.5–15.5)
WBC: 13.5 10*3/uL — AB (ref 4.0–10.5)

## 2017-12-22 NOTE — ED Triage Notes (Signed)
Pt reports L sided HA X1 week, pt checked her BP at home and noted it to be high (139/96) Pt denies CP, SOB, dizziness, lightheadedness, numbness, tingling, weakness, etc.

## 2017-12-23 ENCOUNTER — Emergency Department (HOSPITAL_COMMUNITY)
Admission: EM | Admit: 2017-12-23 | Discharge: 2017-12-23 | Disposition: A | Discharge status: Home / Self Care | Payer: Medicare Other | Attending: Emergency Medicine | Admitting: Emergency Medicine

## 2017-12-23 NOTE — ED Notes (Signed)
12/23/2017, Follow up call completed.

## 2017-12-23 NOTE — ED Notes (Signed)
Pt states that she is leaving due to wait times  

## 2018-10-01 ENCOUNTER — Emergency Department (HOSPITAL_COMMUNITY)
Admission: EM | Admit: 2018-10-01 | Discharge: 2018-10-01 | Disposition: A | Discharge status: Home / Self Care | Payer: Medicare Other | Attending: Emergency Medicine | Admitting: Emergency Medicine

## 2018-10-01 ENCOUNTER — Encounter (HOSPITAL_COMMUNITY): Payer: Self-pay

## 2018-10-01 DIAGNOSIS — Y9389 Activity, other specified: Secondary | ICD-10-CM | POA: Diagnosis not present

## 2018-10-01 DIAGNOSIS — M25512 Pain in left shoulder: Secondary | ICD-10-CM | POA: Insufficient documentation

## 2018-10-01 DIAGNOSIS — Y92410 Unspecified street and highway as the place of occurrence of the external cause: Secondary | ICD-10-CM | POA: Insufficient documentation

## 2018-10-01 DIAGNOSIS — M25551 Pain in right hip: Secondary | ICD-10-CM | POA: Diagnosis not present

## 2018-10-01 DIAGNOSIS — Z79899 Other long term (current) drug therapy: Secondary | ICD-10-CM | POA: Diagnosis not present

## 2018-10-01 DIAGNOSIS — Y999 Unspecified external cause status: Secondary | ICD-10-CM | POA: Insufficient documentation

## 2018-10-01 DIAGNOSIS — M542 Cervicalgia: Secondary | ICD-10-CM | POA: Insufficient documentation
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
## 2018-10-01 NOTE — Discharge Instructions (Addendum)
Take Tylenol or Advil as directed for pain.  See an urgent care center if having significant pain by next week.  Return if concern for any reason

## 2018-10-01 NOTE — ED Notes (Signed)
Pt stable, ambulatory, states understanding of discharge instructions 
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
## 2018-10-28 ENCOUNTER — Encounter (INDEPENDENT_AMBULATORY_CARE_PROVIDER_SITE_OTHER): Payer: Self-pay | Admitting: Orthopedic Surgery

## 2018-10-28 ENCOUNTER — Ambulatory Visit (INDEPENDENT_AMBULATORY_CARE_PROVIDER_SITE_OTHER): Payer: Medicare Other | Admitting: Orthopedic Surgery

## 2018-10-28 ENCOUNTER — Ambulatory Visit (INDEPENDENT_AMBULATORY_CARE_PROVIDER_SITE_OTHER): Payer: Self-pay

## 2018-10-28 ENCOUNTER — Ambulatory Visit (INDEPENDENT_AMBULATORY_CARE_PROVIDER_SITE_OTHER): Payer: Medicare Other

## 2018-10-28 VITALS — Ht 62.0 in | Wt 185.0 lb

## 2018-10-28 DIAGNOSIS — M25512 Pain in left shoulder: Secondary | ICD-10-CM | POA: Diagnosis not present

## 2018-10-28 DIAGNOSIS — G8929 Other chronic pain: Secondary | ICD-10-CM | POA: Diagnosis not present

## 2018-10-28 DIAGNOSIS — M25511 Pain in right shoulder: Secondary | ICD-10-CM

## 2018-10-28 NOTE — Progress Notes (Signed)
Office Visit Note   Patient: Sylvia Sullivan           Date of Birth: 1985-12-30           MRN: [id number] Visit Date: 10/28/2018 Requested by: No referring provider defined for this encounter. PCP: Patient, No Pcp Per  Subjective: Chief Complaint  Patient presents with  . Left Shoulder - Pain    Multiple surgeries to bilateral shoulders RTC tear. Pain left greater than right.   . Right Shoulder - Pain    HPI: Patient presents with bilateral shoulder pain left worse than right.  She is had multiple shoulder surgeries.  She is had about 5 left shoulder surgeries and 3 right shoulder surgeries.  Hard for her to sleep due to pain.  She is taking oxycodone and pain management.  She is right-hand dominant.              ROS: All systems reviewed are negative as they relate to the chief complaint within the history of present illness.  Patient denies  fevers or chills.   Assessment & Plan: Visit Diagnoses:  1. Chronic right shoulder pain   2. Chronic left shoulder pain     Plan: Impression is right shoulder pain and left shoulder pain with evidence of moderate arthritis on x-ray radiographs.  Very difficult problem for 33 year old female.  Definitely too young for any type of shoulder arthroplasty.  Prior MRI scan from 2017 demonstrated some thinning of the supraspinatus tendon but there was no full-thickness re-tear.  On exam she has reasonable strength but pain is her main complaint consistent with diagnosis of arthritis.  No warmth to the shoulder to indicate chronic infection.  Plan at this time is to see if we can get her set up for gel injection into the shoulder under fluoroscopic or ultrasound guidance.  This is an off label use but have had some patients who have benefited from it.  Back once that is approved.  Her best option based on her age moderate but not severe arthritis present.  Follow-Up Instructions: Return if symptoms worsen or fail to improve.   Orders:  Orders Placed  This Encounter  Procedures  . XR Shoulder Left  . XR Shoulder Right   No orders of the defined types were placed in this encounter.     Procedures: No procedures performed   Clinical Data: No additional findings.  Objective: Vital Signs: Ht 5\' 2"  (1.575 m)   Wt 185 lb (83.9 kg)   BMI 33.84 kg/m   Physical Exam:   Constitutional: Patient appears well-developed HEENT:  Head: Normocephalic Eyes:EOM are normal Neck: Normal range of motion Cardiovascular: Normal rate Pulmonary/chest: Effort normal Neurologic: Patient is alert Skin: Skin is warm Psychiatric: Patient has normal mood and affect    Ortho Exam: Ortho exam demonstrates full active and passive range of motion of the right shoulder.  Left shoulder has painful range of motion but she still has more than 90 degrees of forward flexion and abduction.  No definite coarse grinding or crepitus with active or passive range of motion of the left shoulder.  No AC joint tenderness to direct palpation.  No other masses lymphadenopathy or skin changes noted in that left shoulder girdle region.  No warmth to the shoulder.  Rotator cuff strength is actually pretty reasonable to infraspinatus supraspinatus and subscap muscle testing.  Specialty Comments:  No specialty comments available.  Imaging: Xr Shoulder Left  Result Date: 10/28/2018 AP axillary outlet left  shoulder reviewed.  Moderate glenohumeral arthritis is noted with joint space narrowing on the axillary view present.  Evidence of prior biceps tenodesis also present.  No significant inferior humeral spurring.  Acromiohumeral distance is less on the left compared to the right.  Xr Shoulder Right  Result Date: 10/28/2018 AP axillary outlet right shoulder reviewed.  Acromiohumeral distance and AC joint distance maintained.  Shoulder is located.  No degenerative changes present.  Visualized lung fields clear.    PMFS History: There are no active problems to display for  this patient.  Past Medical History:  Diagnosis Date  . Fibromyalgia   . Migraines     History reviewed. No pertinent family history.  Past Surgical History:  Procedure Laterality Date  . CHOLECYSTECTOMY    . KNEE ARTHROSCOPY Bilateral   . ROTATOR CUFF REPAIR Bilateral   . TONSILLECTOMY    . TUBAL LIGATION     Social History   Occupational History  . Not on file  Tobacco Use  . Smoking status: Never Smoker  . Smokeless tobacco: Never Used  Substance and Sexual Activity  . Alcohol use: No  . Drug use: No  . Sexual activity: Not on file

## 2018-11-15 ENCOUNTER — Telehealth (INDEPENDENT_AMBULATORY_CARE_PROVIDER_SITE_OTHER): Payer: Self-pay | Admitting: Orthopedic Surgery

## 2018-11-15 NOTE — Telephone Encounter (Signed)
This patient stated she ws to be scheduled for gel injection. Per Dr Diamantina Providence last office noted that's what he recommended.  Please call patient to advise what the status is on gel injection.  [phone]

## 2018-11-15 NOTE — Telephone Encounter (Signed)
Per Dr. Diamantina Providence last office note.  He would like for patient to have a gel injection for her shoulder.  Toniann Fail stated that she will talk with you more about the pricing.

## 2018-11-16 NOTE — Telephone Encounter (Signed)
What type of gel and what is pricing for patient?

## 2018-11-17 NOTE — Telephone Encounter (Signed)
IC patient and advised I am looking into this, trying to get a sample or donated injection.  Cash price would be $525. That is too much per patient.  If I cannot find meds, she wants to see about coming in next week for a cortisone injection in the meantime. Will f/u with patient tomorrow.

## 2018-12-29 NOTE — Telephone Encounter (Signed)
Scheduled to see Dr August Saucer next Wednesday

## 2018-12-29 NOTE — Telephone Encounter (Signed)
Can you call patient and tell her we still have no samples of gel injection that we could use for her shoulder.  See how she is doing and ask if she wants to come in for cortisone inj with August Saucer. Thanks.

## 2019-01-03 ENCOUNTER — Telehealth (INDEPENDENT_AMBULATORY_CARE_PROVIDER_SITE_OTHER): Payer: Self-pay | Admitting: *Deleted

## 2019-01-03 NOTE — Telephone Encounter (Signed)
Called pt and asked COVID-19 Pre-Screening Questions and they answered no to all questions listed. 

## 2019-01-04 ENCOUNTER — Other Ambulatory Visit: Payer: Self-pay

## 2019-01-04 ENCOUNTER — Encounter (INDEPENDENT_AMBULATORY_CARE_PROVIDER_SITE_OTHER): Payer: Self-pay | Admitting: Orthopedic Surgery

## 2019-01-04 ENCOUNTER — Ambulatory Visit (INDEPENDENT_AMBULATORY_CARE_PROVIDER_SITE_OTHER): Payer: Medicare Other | Admitting: Orthopedic Surgery

## 2019-01-04 ENCOUNTER — Ambulatory Visit (INDEPENDENT_AMBULATORY_CARE_PROVIDER_SITE_OTHER): Payer: Medicare Other

## 2019-01-04 DIAGNOSIS — M65312 Trigger thumb, left thumb: Secondary | ICD-10-CM | POA: Diagnosis not present

## 2019-01-04 DIAGNOSIS — M19012 Primary osteoarthritis, left shoulder: Secondary | ICD-10-CM | POA: Diagnosis not present

## 2019-01-04 DIAGNOSIS — M79642 Pain in left hand: Secondary | ICD-10-CM | POA: Diagnosis not present

## 2019-01-04 NOTE — Progress Notes (Signed)
Office Visit Note   Patient: Sylvia Sullivan           Date of Birth: [birth year]           MRN: [id number] Visit Date: 01/04/2019 Requested by: No referring provider defined for this encounter. PCP: Patient, No Pcp Per  Subjective: Chief Complaint  Patient presents with  . Left Shoulder - Pain    HPI: Patient presents for evaluation of left shoulder pain left hand pain.  She has known history of left shoulder arthritis.  Has had multiple surgeries on the shoulder in the past.  Injections have helped her.  She also reports left thumb pain which she localizes to the A1 pulley.  Does report some occasional discrete locking which is been going on for about 5 days.  Anytime she tries to twist her thumb is painful.  She is predominantly left-hand dominant.              ROS: All systems reviewed are negative as they relate to the chief complaint within the history of present illness.  Patient denies  fevers or chills.   Assessment & Plan: Visit Diagnoses:  1. Pain in left hand   2. Trigger thumb, left thumb   3. Arthritis of left shoulder region     Plan: Impression is left shoulder arthritis with injection in the glenohumeral joint today performed with Dr. Jason Nest kilts doing the procedure.  In regards to that left thumb when a put her in a splint which I wrote a prescription for as well as to have her try topical.  If that does not help then we will consider an ultrasound-guided injection just below the A1 pulley at her next clinic visit in 4 weeks.  Follow-Up Instructions: Return in about 4 weeks (around 02/01/2019).   Orders:  Orders Placed This Encounter  Procedures  . XR Hand Complete Left   No orders of the defined types were placed in this encounter.     Procedures: No procedures performed   Clinical Data: No additional findings.  Objective: Vital Signs: There were no vitals taken for this visit.  Physical Exam:   Constitutional: Patient appears well-developed HEENT:   Head: Normocephalic Eyes:EOM are normal Neck: Normal range of motion Cardiovascular: Normal rate Pulmonary/chest: Effort normal Neurologic: Patient is alert Skin: Skin is warm Psychiatric: Patient has normal mood and affect    Ortho Exam: Ortho exam demonstrates tenderness at the A1 pulley left hand with intact EPL FPL function.  Wrist range of motion is full and intact and grip strength is intact bilaterally.  Shoulder has about 15 to 20 degrees less forward flexion abduction on the left compared to the right but good rotator cuff strength and no instability with negative apprehension testing.  Specialty Comments:  No specialty comments available.  Imaging: Xr Hand Complete Left  Result Date: 01/04/2019 AP lateral oblique left hand reviewed.  No fracture dislocation is noted.  No arthritis is present.  CMC joint appears intact normal left hand    PMFS History: There are no active problems to display for this patient.  Past Medical History:  Diagnosis Date  . Fibromyalgia   . Migraines     History reviewed. No pertinent family history.  Past Surgical History:  Procedure Laterality Date  . CHOLECYSTECTOMY    . KNEE ARTHROSCOPY Bilateral   . ROTATOR CUFF REPAIR Bilateral   . TONSILLECTOMY    . TUBAL LIGATION     Social History   Occupational History  .  Not on file  Tobacco Use  . Smoking status: Never Smoker  . Smokeless tobacco: Never Used  Substance and Sexual Activity  . Alcohol use: No  . Drug use: No  . Sexual activity: Not on file

## 2019-01-06 ENCOUNTER — Telehealth (INDEPENDENT_AMBULATORY_CARE_PROVIDER_SITE_OTHER): Payer: Self-pay | Admitting: Orthopedic Surgery

## 2019-01-06 MED ORDER — METHYLPREDNISOLONE ACETATE 40 MG/ML IJ SUSP
40.0000 mg | Freq: Once | INTRAMUSCULAR | Status: AC
Start: 2019-01-06 — End: ?

## 2019-01-06 MED ORDER — OXYCODONE-ACETAMINOPHEN 5-325 MG PO TABS: 1.0000 | ORAL_TABLET | ORAL | 0 refills | Status: AC | PRN

## 2019-01-06 NOTE — Telephone Encounter (Signed)
Patient called back stating that she is under a pain management contract and the pain management clinic advised her that Dr. Prince Rome was suppose to contact them before prescribing her a narcotic.  Please advise Pt.  Thank you.

## 2019-01-06 NOTE — Telephone Encounter (Signed)
This is not uncommon.  May take a few days to get relief. Rx sent for oxycodone.

## 2019-01-06 NOTE — Addendum Note (Signed)
Addended by: Lillia Carmel on: 01/06/2019 02:12 PM   Modules accepted: Orders

## 2019-01-06 NOTE — Telephone Encounter (Signed)
Patient called stating that she has been in severe pain since she received the cortisone injection.  She wanted to know if Dr. August Saucer could be reached to see what can be done for her.  CB#[phone].  Thank you.

## 2019-01-06 NOTE — Telephone Encounter (Signed)
IC sw patient and advised per Dr Prince Rome.

## 2019-01-06 NOTE — Telephone Encounter (Signed)
Can you advise since Dr August Saucer not here? I think you actually did injection with ultrasound.

## 2019-01-06 NOTE — Telephone Encounter (Signed)
I tried calling [facility] to discuss, no answer, received greeting stating operator not available to take my call, LM asking for RC to discuss.  [phone]

## 2019-01-06 NOTE — Progress Notes (Signed)
Subjective: Patient is here for ultrasound-guided intra-articular left glenohumeral injection.  Objective:  Tender over posterior GH joint.  Procedure: Ultrasound-guided left glenohumeral injection: After sterile prep with Betadine, injected 8 cc 1% lidocaine without epinephrine and 40 mg methylprednisolone using a 22-gauge spinal needle, passing the needle through approach into the glenohumeral joint.  Good pain relief during the immediate anesthetic phase.  Injectate seen filling the joint capsule.

## 2019-01-06 NOTE — Telephone Encounter (Signed)
IC advised we were not aware that she was under pain contract with pain management [facility]

## 2019-01-09 ENCOUNTER — Telehealth (INDEPENDENT_AMBULATORY_CARE_PROVIDER_SITE_OTHER): Payer: Self-pay | Admitting: Orthopedic Surgery

## 2019-01-09 NOTE — Telephone Encounter (Signed)
IC patient, she states that she s/w [facility] and they needed OV note from visit faxed for proof of providing patient with pain medication.   I faxed to Fax [phone] [facility] Attn Magda Paganini

## 2019-01-09 NOTE — Telephone Encounter (Signed)
Patient called asked for a call back from Lauren before the Rx is faxed    The phone is [phone]

## 2019-01-09 NOTE — Telephone Encounter (Signed)
See other note. done 

## 2019-01-09 NOTE — Telephone Encounter (Signed)
Patient left a message wanting to speak with you.  CB#[phone]

## 2019-11-18 IMAGING — CT CT HEAD W/O CM
4 series · 17 of 47 positions shown, 19 images · non-contrast
Comparison: CT of the soft tissues of the neck performed 04/26/2016

CLINICAL DATA: Acute onset of left-sided headaches.

EXAM:
CT HEAD WITHOUT CONTRAST
TECHNIQUE: Contiguous axial images were obtained from the base of the skull
through the vertex without intravenous contrast.

[Series 3: head without · axial · non-contrast · 0.39mm/px · z∈[-118,+2]mm · 7 of 32 slices shown, 9 images]
[im 4/32  brain]
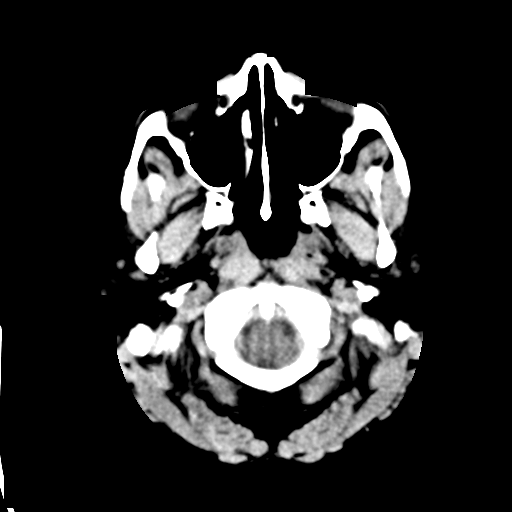
[im 4/32  bone]
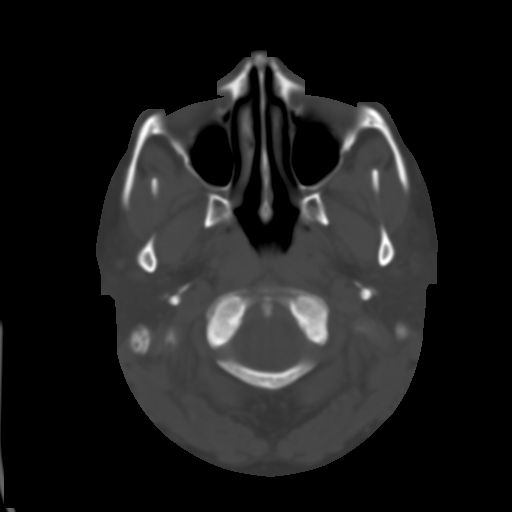
[im 8/32  brain]
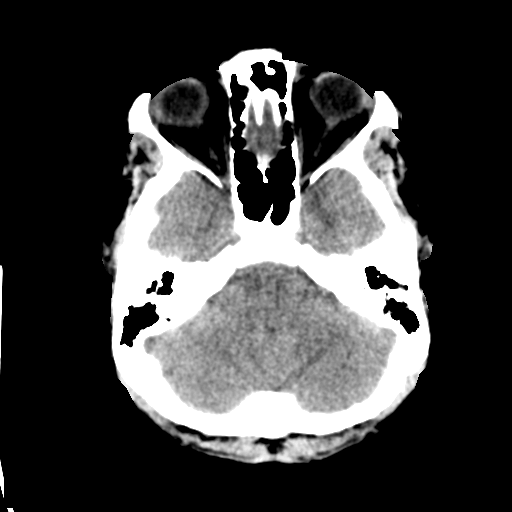
[im 12/32  brain]
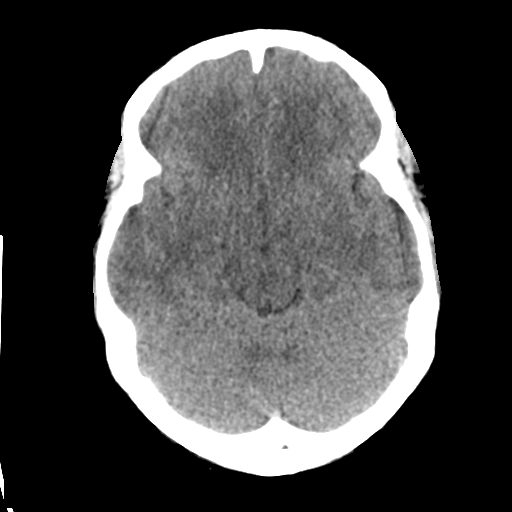
[im 16/32  brain]
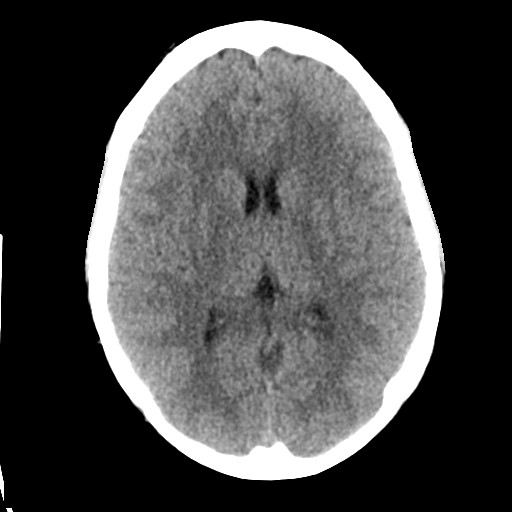
[im 20/32  brain]
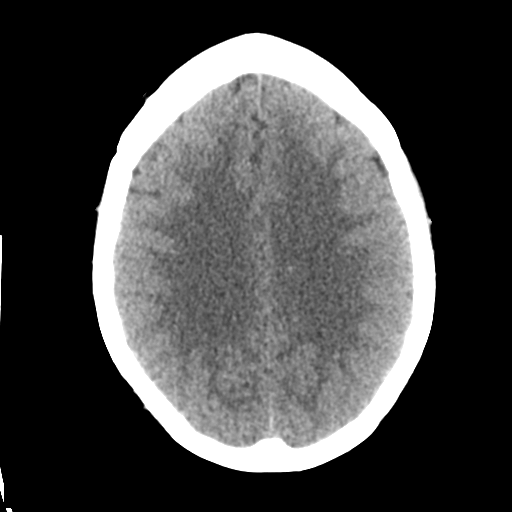
[im 20/32  bone]
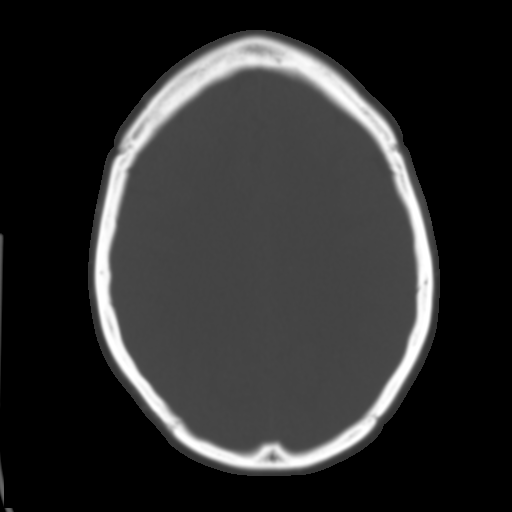
[im 24/32  brain]
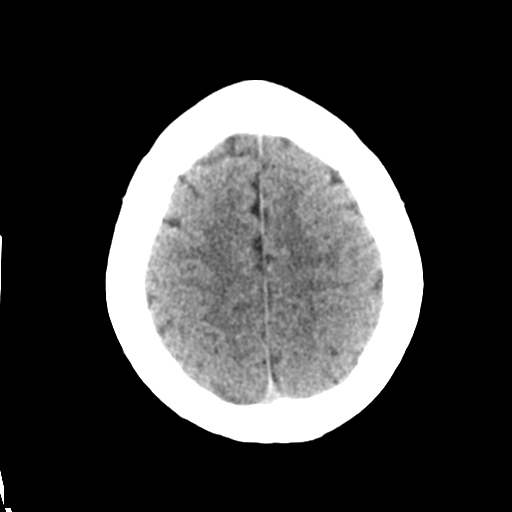
[im 28/32  brain]
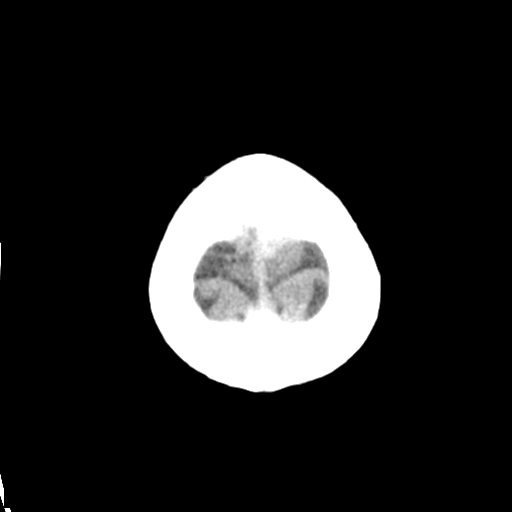

[Series 4: head bone · axial · 0.39mm/px · z∈[-119,-63]mm · 4 of 80 slices shown]
[im 8/80  bone]
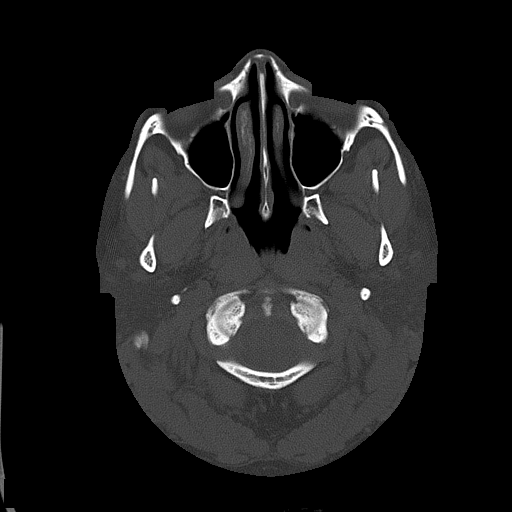
[im 16/80  bone]
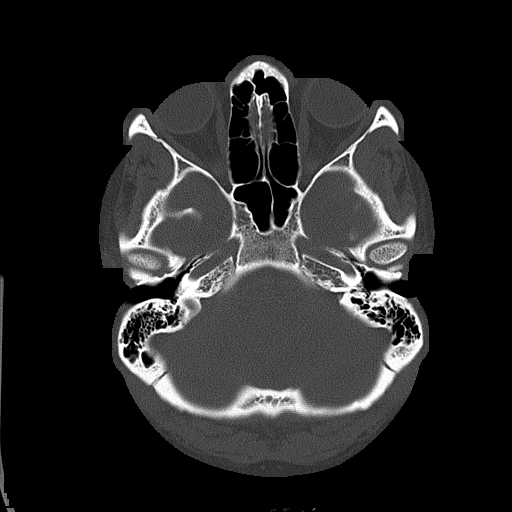
[im 24/80  bone]
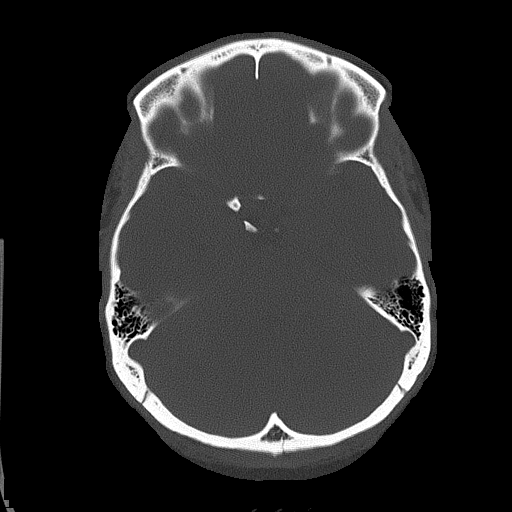
[im 36/80  bone]
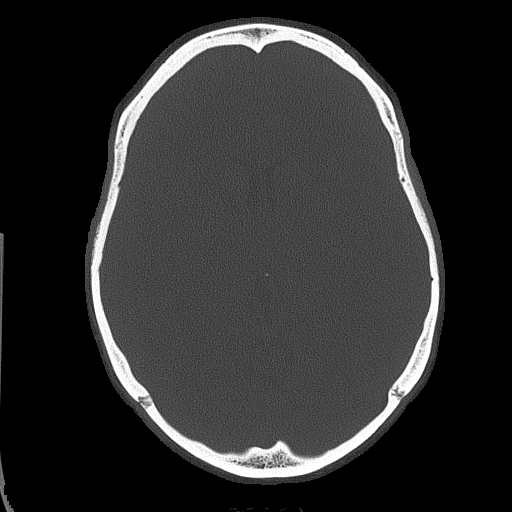

[Series 5: head without cor · coronal · non-contrast · 0.31mm/px · 3 of 67 slices shown]
[im 23/67  brain]
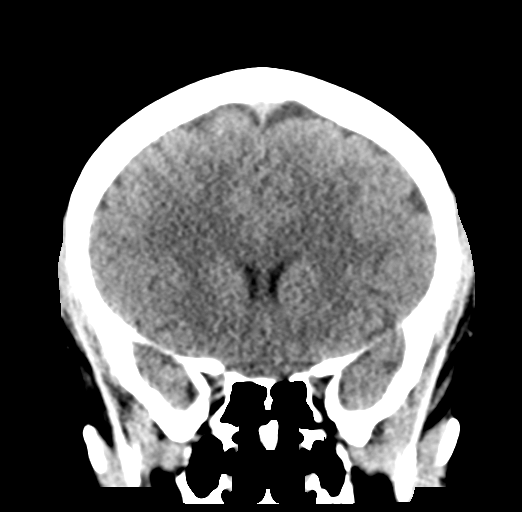
[im 30/67  brain]
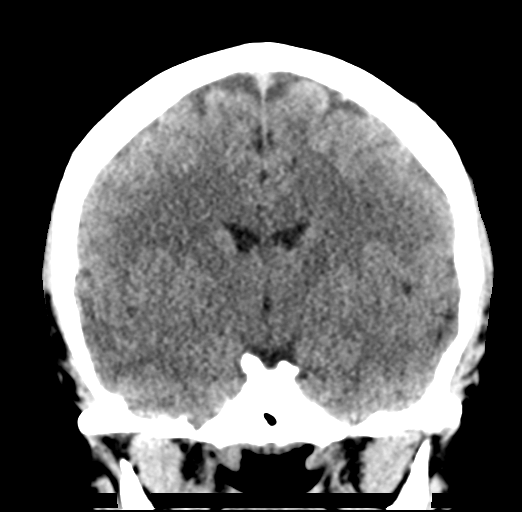
[im 37/67  brain]
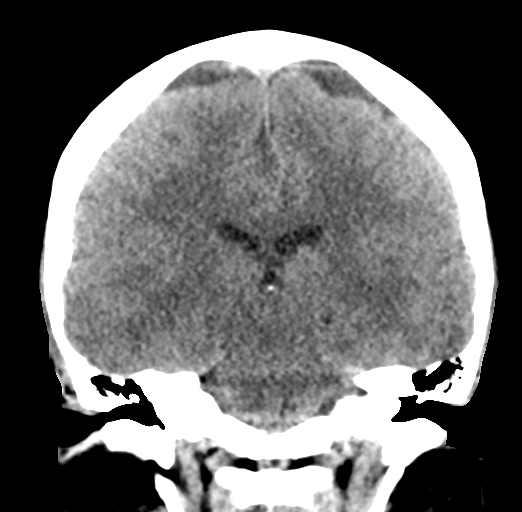

[Series 6: head without sag · sagittal · non-contrast · 0.31mm/px · 3 of 52 slices shown]
[im 18/52  brain]
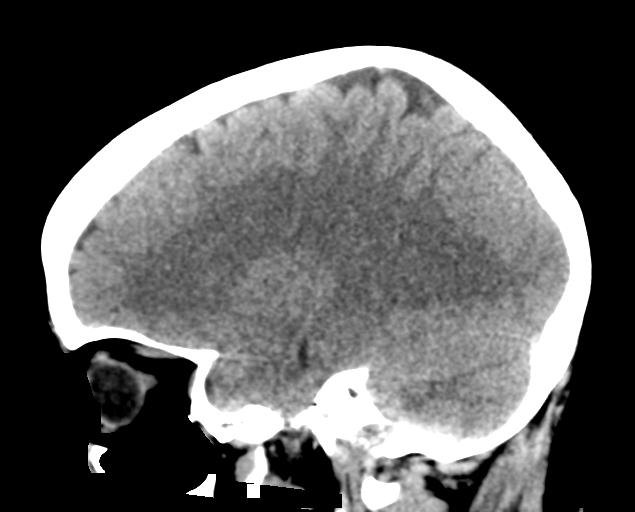
[im 26/52  brain]
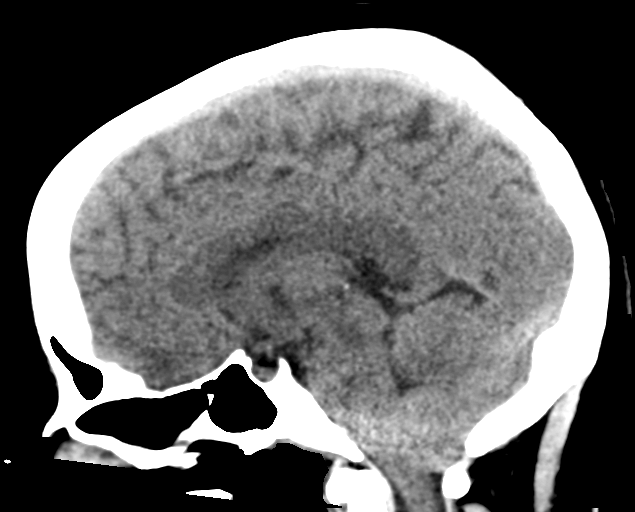
[im 35/52  brain]
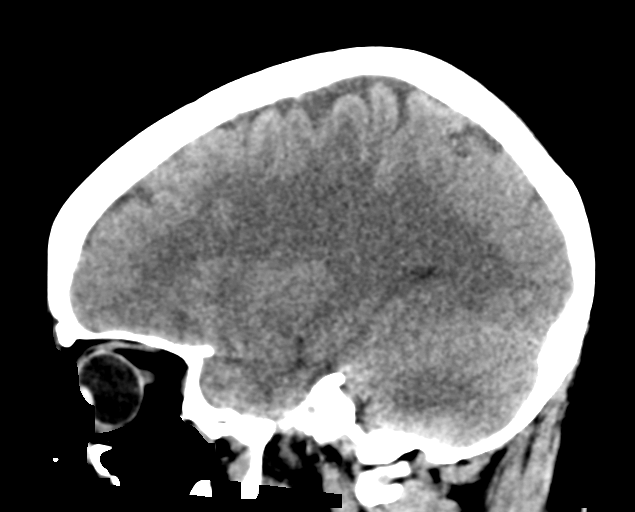

[17 of 47 positions shown; findings below may reference images not displayed]

FINDINGS: Brain: No evidence of acute infarction, hemorrhage, hydrocephalus,
extra-axial collection or mass lesion/mass effect.

The posterior fossa, including the cerebellum, brainstem and fourth
ventricle, is within normal limits. The third and lateral
ventricles, and basal ganglia are unremarkable in appearance. The
cerebral hemispheres are symmetric in appearance, with normal
gray-white differentiation. No mass effect or midline shift is seen.

Vascular: No hyperdense vessel or unexpected calcification.

Skull: There is no evidence of fracture; visualized osseous
structures are unremarkable in appearance.

Sinuses/Orbits: The orbits are within normal limits. The paranasal
sinuses and mastoid air cells are well-aerated.

Other: No significant soft tissue abnormalities are seen.
IMPRESSION: Unremarkable noncontrast CT of the head.
